# Patient Record
Sex: Male | Born: 1965 | Race: White | Hispanic: No | Marital: Married | Smoking: Never smoker
Health system: Southern US, Community
[De-identification: ages and names within clinical notes are randomized; demographics above are authoritative.]

## PROBLEM LIST (undated history)

## (undated) DIAGNOSIS — F32A Depression, unspecified: Secondary | ICD-10-CM

## (undated) DIAGNOSIS — F319 Bipolar disorder, unspecified: Secondary | ICD-10-CM

## (undated) DIAGNOSIS — E785 Hyperlipidemia, unspecified: Secondary | ICD-10-CM

## (undated) DIAGNOSIS — I10 Essential (primary) hypertension: Secondary | ICD-10-CM

## (undated) DIAGNOSIS — F4310 Post-traumatic stress disorder, unspecified: Secondary | ICD-10-CM

## (undated) DIAGNOSIS — I639 Cerebral infarction, unspecified: Secondary | ICD-10-CM

## (undated) HISTORY — PX: OTHER SURGICAL HISTORY: SHX169

## (undated) HISTORY — DX: Bipolar disorder, unspecified: F31.9

## (undated) HISTORY — PX: CHOLECYSTECTOMY: SHX55

## (undated) HISTORY — PX: HERNIA REPAIR: SHX51

## (undated) HISTORY — DX: Hyperlipidemia, unspecified: E78.5

---

## 2004-11-30 ENCOUNTER — Ambulatory Visit: Payer: Self-pay | Admitting: Family Medicine

## 2007-03-24 ENCOUNTER — Emergency Department: Payer: Self-pay | Admitting: Emergency Medicine

## 2007-05-04 ENCOUNTER — Emergency Department: Payer: Self-pay | Admitting: Emergency Medicine

## 2011-02-04 ENCOUNTER — Ambulatory Visit: Payer: Self-pay | Admitting: Internal Medicine

## 2011-03-10 ENCOUNTER — Ambulatory Visit: Payer: Self-pay | Admitting: Unknown Physician Specialty

## 2011-03-22 ENCOUNTER — Ambulatory Visit: Payer: Self-pay | Admitting: Unknown Physician Specialty

## 2011-03-23 LAB — PATHOLOGY REPORT

## 2012-02-13 LAB — URINALYSIS, COMPLETE
Bilirubin,UR: NEGATIVE
Blood: NEGATIVE
Glucose,UR: NEGATIVE mg/dL (ref 0–75)
Ketone: NEGATIVE
Leukocyte Esterase: NEGATIVE
RBC,UR: 1 /HPF (ref 0–5)
Specific Gravity: 1.03 (ref 1.003–1.030)
Squamous Epithelial: NONE SEEN

## 2012-02-13 LAB — DRUG SCREEN, URINE
Amphetamines, Ur Screen: NEGATIVE (ref ?–1000)
Barbiturates, Ur Screen: NEGATIVE (ref ?–200)
MDMA (Ecstasy)Ur Screen: NEGATIVE (ref ?–500)
Methadone, Ur Screen: NEGATIVE (ref ?–300)
Opiate, Ur Screen: NEGATIVE (ref ?–300)
Phencyclidine (PCP) Ur S: NEGATIVE (ref ?–25)

## 2012-02-13 LAB — COMPREHENSIVE METABOLIC PANEL
Albumin: 4.3 g/dL (ref 3.4–5.0)
Alkaline Phosphatase: 85 U/L (ref 50–136)
Anion Gap: 9 (ref 7–16)
BUN: 14 mg/dL (ref 7–18)
Bilirubin,Total: 2.4 mg/dL — ABNORMAL HIGH (ref 0.2–1.0)
Calcium, Total: 9.1 mg/dL (ref 8.5–10.1)
Chloride: 103 mmol/L (ref 98–107)
Creatinine: 1.25 mg/dL (ref 0.60–1.30)
Osmolality: 283 (ref 275–301)
Potassium: 4 mmol/L (ref 3.5–5.1)
SGPT (ALT): 27 U/L
Sodium: 141 mmol/L (ref 136–145)
Total Protein: 7.5 g/dL (ref 6.4–8.2)

## 2012-02-13 LAB — ETHANOL
Ethanol %: <0.003 % (ref 0.000–0.080)
Ethanol: <3 mg/dL

## 2012-02-13 LAB — CBC
HCT: 46 % (ref 40.0–52.0)
HGB: 16 g/dL (ref 13.0–18.0)
MCHC: 34.7 g/dL (ref 32.0–36.0)
MCV: 88 fL (ref 80–100)
Platelet: 215 10*3/uL (ref 150–440)

## 2012-02-13 LAB — TSH: Thyroid Stimulating Horm: 0.39 u[IU]/mL — ABNORMAL LOW

## 2012-02-14 ENCOUNTER — Inpatient Hospital Stay: Payer: Self-pay | Admitting: Psychiatry

## 2012-02-17 LAB — HEPATIC FUNCTION PANEL A (ARMC)
Albumin: 4.3 g/dL (ref 3.4–5.0)
Alkaline Phosphatase: 83 U/L (ref 50–136)
Bilirubin, Direct: 0.1 mg/dL (ref 0.00–0.20)
Bilirubin,Total: 0.8 mg/dL (ref 0.2–1.0)

## 2012-02-20 LAB — SALICYLATE LEVEL: Salicylates, Serum: <1.7 mg/dL

## 2012-02-20 LAB — DRUG SCREEN, URINE
Benzodiazepine, Ur Scrn: POSITIVE (ref ?–200)
Cannabinoid 50 Ng, Ur ~~LOC~~: NEGATIVE (ref ?–50)
Cocaine Metabolite,Ur ~~LOC~~: NEGATIVE (ref ?–300)
MDMA (Ecstasy)Ur Screen: NEGATIVE (ref ?–500)
Methadone, Ur Screen: NEGATIVE (ref ?–300)
Opiate, Ur Screen: NEGATIVE (ref ?–300)
Phencyclidine (PCP) Ur S: NEGATIVE (ref ?–25)
Tricyclic, Ur Screen: NEGATIVE (ref ?–1000)

## 2012-02-20 LAB — URINALYSIS, COMPLETE
Bilirubin,UR: NEGATIVE
Blood: NEGATIVE
Ketone: NEGATIVE
Ph: 7 (ref 4.5–8.0)
Protein: NEGATIVE
Specific Gravity: 1.02 (ref 1.003–1.030)
Squamous Epithelial: NONE SEEN

## 2012-02-20 LAB — CBC
HCT: 48 % (ref 40.0–52.0)
HGB: 16.2 g/dL (ref 13.0–18.0)
MCH: 29.9 pg (ref 26.0–34.0)
MCHC: 33.7 g/dL (ref 32.0–36.0)
MCV: 89 fL (ref 80–100)
Platelet: 235 10*3/uL (ref 150–440)
RBC: 5.41 10*6/uL (ref 4.40–5.90)

## 2012-02-20 LAB — COMPREHENSIVE METABOLIC PANEL
Alkaline Phosphatase: 83 U/L (ref 50–136)
Bilirubin,Total: 0.9 mg/dL (ref 0.2–1.0)
Calcium, Total: 9 mg/dL (ref 8.5–10.1)
Co2: 25 mmol/L (ref 21–32)
Creatinine: 1.05 mg/dL (ref 0.60–1.30)
EGFR (African American): >60
EGFR (Non-African Amer.): >60
SGOT(AST): 31 U/L (ref 15–37)
SGPT (ALT): 33 U/L

## 2012-02-20 LAB — ETHANOL
Ethanol %: <0.003 % (ref 0.000–0.080)
Ethanol: <3 mg/dL

## 2012-02-21 ENCOUNTER — Inpatient Hospital Stay: Payer: Self-pay | Admitting: Psychiatry

## 2012-02-21 DIAGNOSIS — Z01810 Encounter for preprocedural cardiovascular examination: Secondary | ICD-10-CM

## 2012-02-21 LAB — CARBAMAZEPINE LEVEL, TOTAL: Carbamazepine: 11.6 ug/mL (ref 4.0–12.0)

## 2012-02-28 ENCOUNTER — Ambulatory Visit: Payer: Self-pay | Admitting: Unknown Physician Specialty

## 2012-03-26 ENCOUNTER — Ambulatory Visit: Payer: Self-pay | Admitting: Psychiatry

## 2012-04-25 ENCOUNTER — Ambulatory Visit: Payer: Self-pay | Admitting: Psychiatry

## 2012-05-26 ENCOUNTER — Ambulatory Visit: Payer: Self-pay | Admitting: Psychiatry

## 2013-01-11 ENCOUNTER — Ambulatory Visit: Payer: Self-pay | Admitting: Family Medicine

## 2013-01-31 ENCOUNTER — Ambulatory Visit: Payer: Self-pay | Admitting: Family Medicine

## 2014-08-26 DIAGNOSIS — I639 Cerebral infarction, unspecified: Secondary | ICD-10-CM

## 2014-08-26 HISTORY — DX: Cerebral infarction, unspecified: I63.9

## 2014-09-20 ENCOUNTER — Ambulatory Visit: Payer: Self-pay | Admitting: Ophthalmology

## 2014-09-20 ENCOUNTER — Inpatient Hospital Stay: Payer: Self-pay | Admitting: Internal Medicine

## 2014-09-20 LAB — COMPREHENSIVE METABOLIC PANEL
Albumin: 3.9 g/dL (ref 3.4–5.0)
Alkaline Phosphatase: 85 U/L
Anion Gap: 5 — ABNORMAL LOW (ref 7–16)
BUN: 18 mg/dL (ref 7–18)
Bilirubin,Total: 1.2 mg/dL — ABNORMAL HIGH (ref 0.2–1.0)
CO2: 27 mmol/L (ref 21–32)
CREATININE: 1.23 mg/dL (ref 0.60–1.30)
Calcium, Total: 8.8 mg/dL (ref 8.5–10.1)
Chloride: 108 mmol/L — ABNORMAL HIGH (ref 98–107)
EGFR (African American): >60
EGFR (Non-African Amer.): >60
Glucose: 107 mg/dL — ABNORMAL HIGH (ref 65–99)
Osmolality: 282 (ref 275–301)
Potassium: 4.3 mmol/L (ref 3.5–5.1)
SGOT(AST): 42 U/L — ABNORMAL HIGH (ref 15–37)
SGPT (ALT): 43 U/L
Sodium: 140 mmol/L (ref 136–145)
Total Protein: 7.1 g/dL (ref 6.4–8.2)

## 2014-09-20 LAB — TROPONIN I
Troponin-I: <0.02 ng/mL
Troponin-I: <0.02 ng/mL
Troponin-I: <0.02 ng/mL

## 2014-09-20 LAB — CK TOTAL AND CKMB (NOT AT ARMC)
CK, TOTAL: 286 U/L
CK, TOTAL: 197 U/L
CK, Total: 218 U/L
CK-MB: 6.4 ng/mL — ABNORMAL HIGH (ref 0.5–3.6)
CK-MB: 5 ng/mL — ABNORMAL HIGH (ref 0.5–3.6)
CK-MB: 4.8 ng/mL — ABNORMAL HIGH (ref 0.5–3.6)

## 2014-09-20 LAB — PROTIME-INR
INR: 1
Prothrombin Time: 12.8 secs (ref 11.5–14.7)

## 2014-09-20 LAB — CBC WITH DIFFERENTIAL/PLATELET
BASOS PCT: 0.5 %
Basophil #: 0 10*3/uL (ref 0.0–0.1)
Eosinophil #: 0.2 10*3/uL (ref 0.0–0.7)
Eosinophil %: 3.3 %
HCT: 49.2 % (ref 40.0–52.0)
HGB: 16.4 g/dL (ref 13.0–18.0)
Lymphocyte #: 2.2 10*3/uL (ref 1.0–3.6)
Lymphocyte %: 30.4 %
MCH: 30 pg (ref 26.0–34.0)
MCHC: 33.3 g/dL (ref 32.0–36.0)
MCV: 90 fL (ref 80–100)
MONOS PCT: 7.9 %
Monocyte #: 0.6 x10 3/mm (ref 0.2–1.0)
Neutrophil #: 4.2 10*3/uL (ref 1.4–6.5)
Neutrophil %: 57.9 %
Platelet: 215 10*3/uL (ref 150–440)
RBC: 5.46 10*6/uL (ref 4.40–5.90)
RDW: 13.9 % (ref 11.5–14.5)
WBC: 7.3 10*3/uL (ref 3.8–10.6)

## 2014-09-20 LAB — LITHIUM LEVEL: Lithium: 0.43 mmol/L — ABNORMAL LOW

## 2014-09-20 LAB — LIPID PANEL
Cholesterol: 183 mg/dL (ref 0–200)
HDL: 53 mg/dL (ref 40–60)
LDL CHOLESTEROL, CALC: 98 mg/dL (ref 0–100)
TRIGLYCERIDES: 162 mg/dL (ref 0–200)
VLDL CHOLESTEROL, CALC: 32 mg/dL (ref 5–40)

## 2014-09-20 LAB — APTT: Activated PTT: 27.8 secs (ref 23.6–35.9)

## 2014-09-21 LAB — CBC WITH DIFFERENTIAL/PLATELET
BASOS PCT: 0.6 %
Basophil #: 0 10*3/uL (ref 0.0–0.1)
Eosinophil #: 0.2 10*3/uL (ref 0.0–0.7)
Eosinophil %: 3.2 %
HCT: 47.6 % (ref 40.0–52.0)
HGB: 15.3 g/dL (ref 13.0–18.0)
Lymphocyte #: 2.2 10*3/uL (ref 1.0–3.6)
Lymphocyte %: 31.9 %
MCH: 29.3 pg (ref 26.0–34.0)
MCHC: 32.1 g/dL (ref 32.0–36.0)
MCV: 91 fL (ref 80–100)
Monocyte #: 0.5 x10 3/mm (ref 0.2–1.0)
Monocyte %: 7.9 %
NEUTROS ABS: 3.8 10*3/uL (ref 1.4–6.5)
Neutrophil %: 56.4 %
Platelet: 217 10*3/uL (ref 150–440)
RBC: 5.22 10*6/uL (ref 4.40–5.90)
RDW: 13.5 % (ref 11.5–14.5)
WBC: 6.8 10*3/uL (ref 3.8–10.6)

## 2014-09-21 LAB — BASIC METABOLIC PANEL
ANION GAP: 4 — AB (ref 7–16)
BUN: 14 mg/dL (ref 7–18)
CALCIUM: 9 mg/dL (ref 8.5–10.1)
CREATININE: 1.34 mg/dL — AB (ref 0.60–1.30)
Chloride: 106 mmol/L (ref 98–107)
Co2: 29 mmol/L (ref 21–32)
Glucose: 97 mg/dL (ref 65–99)
OSMOLALITY: 278 (ref 275–301)
Potassium: 3.9 mmol/L (ref 3.5–5.1)
Sodium: 139 mmol/L (ref 136–145)

## 2015-04-18 NOTE — H&P (Signed)
PATIENT NAME:  Ryan Gomez, Alastor S MR#:  [id number] DATE OF BIRTH:  1966/05/17  DATE OF ADMISSION:  09/20/2014  ADMITTING PHYSICIAN: Enid Baasadhika Waverley Krempasky, MD  [facility]IC[facility]: [facility]  CHIEF COMPLAINT: Right lateral visual loss.   HISTORY OF PRESENT ILLNESS: Mr. Ryan Gomez is a 49 year old Caucasian male with a past medical history significant for bipolar with depression who presents to the hospital as his outpatient MRI was positive for a stroke. The patient describes his symptoms started about 2 weeks ago, then he noticed that he was having right-sided peripheral vision loss. He was walking with  his wife and he could not see her when she was on the right side and all of a sudden, she presented into the visual field. He had to turn his head to look at things on the right side. He went to see an ophthalmologist about the same time, who did visual field testing and, because of a visual field defect, he ordered an outpatient MRI. The MRI was done today; it showed subacute left occipital infarct, and the patient was called in to be admitted to the hospital. He complains of some headache, mostly right frontal lobe, but other than that, he denies any tingling, numbness, localized weakness, slurred speech, or any other changes.   PAST MEDICAL HISTORY: Bipolar with depression and anxiety.   PAST SURGICAL HISTORY: None.  ALLERGIES TO MEDICATIONS: No known drug allergies.   CURRENT HOME MEDICATION:  1.  Lithium 900 mg p.o. at bedtime.  2.  Wellbutrin 150 mg p.o. b.i.d.  3.  Buspirone 10 mg p.o. b.i.d.  4.  Fluoxetine 40 mg p.o. daily.  5.  Omeprazole 20 mg p.o. daily.   SOCIAL HISTORY: Lives at home with his wife. Works as a Chartered certified accountantmachinist. No smoking. Occasional alcohol use.   FAMILY HISTORY: Diabetes and heart stroke runs on both sides of the family.   REVIEW OF SYSTEMS:  CONSTITUTIONAL: No fever, fatigue, or weakness.  EYES: Right peripheral visual loss. No inflammation, glaucoma, or cataracts.   ENT: No tinnitus, ear pain, hearing loss, epistaxis, or discharge.  RESPIRATORY: No cough, wheeze, hemoptysis, or COPD.  CARDIOVASCULAR: No chest pain, orthopnea, edema, arrhythmia, palpitations or syncope.  GASTROINTESTINAL: No nausea, vomiting, diarrhea, abdominal pain, hematemesis or melena.  GENITOURINARY: No dysuria, hematuria, renal calculus, frequency, or incontinence.  ENDOCRINE: No polyuria, nocturia, thyroid problems, heat or cold intolerance.  HEMATOLOGY: No anemia, easy bruising or bleeding.  SKIN: No acne, rash or lesions.  MUSCULOSKELETAL: No neck fracture, pain, arthritis, or gout. NEUROLOGIC: Positive for right peripheral vision loss with CVA positive on MRI. No TIA, seizures, tremors, vertigo, numbness, or weakness.  PSYCHOLOGICAL: History of anxiety and depression; none currently.   PHYSICAL EXAMINATION:  VITAL SIGNS: Temperature 98.3 degree Fahrenheit, pulse 54, respirations 16, blood pressure 120/84, pulse oximetry 100% on room air.  GENERAL: Well-built, well-nourished male, lying in bed, not in any acute distress.  HEENT: Normocephalic, atraumatic. Pupils equal, round and reacting to light. Anicteric sclerae. Extraocular movements intact. Oropharynx clear without erythema, mass or exudates.   NECK: Supple. No thyromegaly, JVD or carotid bruits. No lymphadenopathy.  LUNGS: Moving air bilaterally. No wheeze or crackles. No use of accessory muscles for breathing.  CARDIOVASCULAR: S1, S2, regular rate and rhythm. No murmurs, rubs, or gallops.  ABDOMEN: Soft, nontender, nondistended. No hepatosplenomegaly. Normal bowel sounds.  EXTREMITIES: No pedal edema. No clubbing or cyanosis. There are 2+ dorsalis pulses palpable bilaterally.  SKIN: No acne, rash or lesions.  LYMPHATICS: No cervical or inguinal  lymphadenopathy.  NEUROLOGIC: Positive for right-sided hemianopsia, but otherwise cranial nerves intact and 5/5 strength in all 4 extremities. Sensation intact. Normal cerebellar  function tests.  PSYCHOLOGIC: The patient is awake, alert, oriented x 3.   LABORATORY DATA: WBC 7.3, hemoglobin 16.4, hematocrit 49.2, platelet count 215,000.   Sodium 140, potassium 4.3, chloride 108, bicarbonate 27, BUN 18, creatinine 1.23, glucose 107, and calcium of 8.8.   ALT 43, AST 42, alkaline phosphatase 85, total bilirubin 1.2, albumin of 3.9. Troponins negative. INR is 1.0.   MRI of the brain, with and without contrast, showing a subacute infarct in the left occipital lobe. No other abnormality noted in the orbits.   EKG: Sinus bradycardia. No acute ST-T wave abnormalities. Heart rate of 47.   ASSESSMENT AND PLAN: A 49 year old male with history of bipolar depression comes with right hemianopsia for 2 weeks and MRI with subacute infarct.  1.  Cerebrovascular accident with subacute left occipital infarct; symptoms started 2 weeks ago. We will admit for neuro checks, carotid Dopplers, echocardiogram, start on aspirin, check lipid profile. To be discharged tomorrow, likely.  2.  Bradycardia. Sinus brady, no arrhythmias. Monitor on telemetry for 24 hours. Not symptomatic. Continue to monitor.  3.  Bipolar with depression. Continue home medications at this time.  4.  Deep vein thrombosis prophylaxis.   CODE STATUS: Full Code.   TIME SPENT ON ADMISSION: 50 minutes   ____________________________ Enid Baas, MD rk:MT D: 09/20/2014 14:52:24 ET T: 09/20/2014 15:22:16 ET JOB#: [id number]  cc: Enid Baas, MD, <Dictator> Enid Baas MD ELECTRONICALLY SIGNED 09/25/2014 9:50

## 2015-04-18 NOTE — Discharge Summary (Signed)
PATIENT NAME:  Ryan Gomez, Ryan Gomez MR#:  [id number] DATE OF BIRTH:  [birth year]  ADMISSION DIAGNOSIS: Subacute cerebrovascular accident.   DISCHARGE DIAGNOSES: 1.  Subacute occipital cerebrovascular accident. 2.  Bradycardia, asymptomatic.  3.  Bipolar affective disorder.   CONSULTATIONS: None.   IMAGING:  1.  The patient had an MRI, which was positive for suboccipital left CVA.  2.  Carotid ultrasound showed no evidence of significant stenosis. 3.  A 2-D echocardiogram showed an EF of 55% to 60%, with impaired relaxation, LV diastolic dysfunction.  4.  LDL was 98, cholesterol 183.   HOSPITAL COURSE: A 49 year old male who was sent from his ophthalmologist'Gomez office due to an MRI that showed occipital CVA.  For further details, please refer to the H and P.  1.  Subacute left occipital infarct that happened about 2 weeks ago; symptoms are improving. The patient was admitted for neurologic checks, carotid Dopplers and echocardiogram. His echocardiogram and carotid Dopplers were essentially unremarkable. He will continue aspirin and statin.  2. Bradycardia, sinus bradycardia, no arrhythmia. The patient is asymptomatic. A 2-D echocardiogram was unremarkable.  3.  History of bipolar with depression. The patient is continued on his outpatient medication.   DISCHARGE MEDICATIONS: 1.  Aspirin 325 daily.  2.  Simvastatin 10 mg at bedtime. 3.  Buspirone 10 mg b.i.d.  4.  Omeprazole 20 mg daily. 5.  Lithium 900 mg at bedtime.  6.  Fluoxetine 40 mg daily.  7.  Wellbutrin 150 b.i.d.   DISCHARGE DIET: Low sodium.   DISCHARGE ACTIVITY: As tolerated.   DISCHARGE FOLLOWUP: The patient can follow up with his primary care physician in 1 week. The patient is stable for discharge.  TIME 35 minutes ____________________________ Devine Dant P. Juliene PinaMody, MD spm:LT D: 09/21/2014 11:24:42 ET T: 09/21/2014 16:12:00 ET JOB#: [id number]  cc: Miarose Lippert P. Juliene PinaMody, MD, <Dictator> [facility] Breanda Greenlaw  MD ELECTRONICALLY SIGNED 09/22/2014 13:17

## 2015-04-19 NOTE — H&P (Signed)
PATIENT NAME:  Ryan Gomez, Ryan Gomez MR#:  [id number] DATE OF BIRTH:  1966/04/21  DATE OF ADMISSION:  02/14/2012  REFERRING PHYSICIAN: Dr. Daryel NovemberJonathan Williams ATTENDING PHYSICIAN: Dr. Kristine LineaJolanta Pleas Carneal  IDENTIFYING DATA: Mr. Ryan Gomez is a 49 year old male with history of treatment resistant depression.   CHIEF COMPLAINT: "Dr. Lafayette Dragonarr sent me here."   HISTORY OF PRESENT ILLNESS: Mr. Ryan Gomez has been treated for depression at least since 1992. He has been lately in the care of Dr. Lafayette Dragonarr in [place] for the past 7 to 8 years. He reports that he has been tried on every medication known to man except for lithium. On the day of admission he had an appointment with Dr. 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 He reportedly put the belt around his neck and tried to suffocate himself. Dr. Lafayette Dragonarr referred him to the nearest Emergency Room immediately. He came to the Emergency Room accompanied by his wife and his son. The wife is very supportive. She is shaken. The patient has no history of suicide attempts in the past. Also Dr. Lafayette Dragonarr suggested that maybe she ran out of ideas for further medication management and suggested ECT. The wife is very uncertain about ECT. The patient seems open minded. He has been recently taking Cymbalta. This was started just two weeks ago, switched from Zoloft. In addition to Cymbalta he is taking Synthroid for augmentation. Dr. Lafayette Dragonarr suggested also lithium for augmentation in the future if there was no improvement. The patient denies alcohol, prescription pills, or illicit substance use. He denies psychotic symptoms or symptoms suggestive of bipolar mania recently, however, he was in a lot of trouble when he was serving in [employer] in the 90s.  Actually he was discharged with a diagnosis of personality disorder in [id number]. Prior to his discharge from the [employer] he got into some trouble in [place], was treated in [place] and was on a suicide watch until he was discharged from the [employer]. He denies as above suicide attempts and does not believes that his discharge diagnosis was accurate but does report periods of time in the past of increased mood, insomnia, hyperactivity, pressured speech, racing thoughts, high energy and feeling of invincibility. This type of symptoms can easily get you in trouble with the [employer] and Compan[employer].   PAST PSYCHIATRIC HISTORY: Other than hospitalization in [place] in 1992 he has never been hospitalized. Denies suicide attempt. He is unable to provide me with a list of medications that have been tried. Upon review apparently he is not familiar with the name Tegretol and he has never been tried on Luvox even though he reports not only symptoms of anxiety but also symptoms suggestive of obsessive-compulsive traits. He says that it is better now but he used to count, check, clean. It was the reason for many arguments at home when he needed everything organized. 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 He reports that he has been wasting a lot of time to check, recheck and recheck the specifications again.   FAMILY PSYCHIATRIC HISTORY: None reported.   PAST MEDICAL HISTORY:  1. Hypothyroidism.  2. Gastroesophageal reflux disease.   ALLERGIES: No known drug allergies.   MEDICATIONS ON ADMISSION:  1. Xanax 1 mg 3 times daily.  2. Cymbalta 60 mg daily.  3. Synthroid 25 mcg daily.  4. Prilosec 20 mg daily.   SOCIAL HISTORY: He is married. He has a son. He feels terribly guilty that his depression interferes with his family life. He does not feel that he has been sensitive to his wife and son'Gomez needs. Oftentimes he is irritated or unable to participate in family events. He, as above, works as a  Chartered certified accountant. He finds this job too difficult to handle lately. He is working with his employer to decrease his responsibilities. I think that he is getting ready to apply for disability.   LEGAL HISTORY: There are no legal troubles.    REVIEW OF SYSTEMS: CONSTITUTIONAL: No fevers or chills. No weight changes. EYES: No double or blurred vision. ENT: No hearing loss. RESPIRATORY: No shortness of breath or cough. CARDIOVASCULAR: No chest pain or orthopnea. GASTROINTESTINAL: No abdominal pain, nausea, vomiting, or diarrhea. GENITOURINARY: No incontinence or frequency. ENDOCRINE: No heat or cold intolerance. LYMPHATIC: No anemia or easy bruising. INTEGUMENTARY: No acne, rash. MUSCULOSKELETAL: Normal muscle strength in all extremities. NEUROLOGICAL: No tingling or weakness. PSYCHIATRIC: See history of present illness for details.   PHYSICAL EXAMINATION:  VITAL SIGNS: Blood pressure 115/72, pulse 71, respirations 18, temperature 98.1.   GENERAL: This is a well-developed male in no acute distress.   HEENT: The pupils are equal, round, and reactive to light. Sclera anicteric.   NECK: Supple. No thyromegaly.   LUNGS: Clear to auscultation. No dullness to percussion.   HEART: Regular rhythm and rate. No murmurs, rubs, or gallops.   ABDOMEN: Soft, nontender, nondistended. Positive bowel sounds.   MUSCULOSKELETAL: Normal muscle strength in all extremities.   SKIN: No rashes or bruises.   LYMPHATIC: No cervical adenopathy.   NEUROLOGIC: Cranial nerves II through XII are intact. Normal gait.   LABORATORY, DIAGNOSTIC, AND RADIOLOGICAL DATA: Chemistries are within normal limits except for blood glucose of 126. Blood alcohol level zero. LFTs within normal limits except for elevated total bilirubin 2.4. TSH 0.39. Urine tox screen positive for benzodiazepines. CBC within normal limits. Urinalysis is not suggestive of urinary tract infection.   MENTAL STATUS EXAMINATION ON ADMISSION: The patient is alert and  oriented to person, place, time, and situation. He is pleasant, polite, and cooperative but there is severe psychomotor retardation. He maintains limited eye contact. His speech is slow and deliberate. He is wearing hospital scrubs and a yellow shirt. Mood is depressed with flat affect. Thought processing is logical and goal oriented but slow. He denies suicidal or homicidal ideations but came to the hospital after placing a belt around his neck to suffocate. There are no thoughts of hurting others. There are no delusions or paranoia. There are no visual or auditory hallucinations. His cognition is grossly intact. He registers three out of three and recalls three out of three objects after five minutes. He can spell world forwards and backwards. He can name current president. His abstraction is intact. His insight and judgment are fair.   SUICIDE RISK ASSESSMENT ON ADMISSION: This is a patient with a long history of mental illness, mood instability, treatment resistant depression who came to the hospital after a suicidal gesture.   ASSESSMENT:  AXIS I: Mood disorder, not otherwise specified.   AXIS II: Personality disorder, not otherwise specified.   AXIS III:  1. Gastroesophageal reflux disease. 2. Hypothyroidism.   AXIS IV: Treatment resistant depression, occupation, family conflict.   AXIS V: GAF on admission 20.   PLAN: The patient was admitted to [facility] for safety, stabilization and medication management. He was initially placed on suicide precautions and was closely monitored for any unsafe behaviors. He underwent full psychiatric and risk assessment. He received pharmacotherapy, individual and group psychotherapy, substance abuse counseling, and support from therapeutic milieu.  1. Suicidality. This has resolved. The patient is able to contract for safety.  2. Depression and anxiety. It is difficult to see whether or not diagnosis of  bipolar disorder was considered. The patient was treated with Depakote and Lamictal as far as he remembers. We will start Tegretol for mood stabilization. Will switch him from Cymbalta to Luvox higher dose, 200-300 mg a day, to address OCD symptoms. I will place him on him a sleeping aid.  3. Diagnostic clarification. Will order MMPI and psychological consultation.  4. Medical. We will continue Prilosec and Synthroid.  5. ECT. One of the goals of this hospitalization is to consider ECT. The patient is uncertain. I will ask Dr. Toni Amend for a consultation.      6. Disposition. He will be discharged to home with his wife. Will follow up with Dr. Lafayette Dragon.  ____________________________ Ellin Goodie. Jennet Maduro, MD jbp:cms D: 02/15/2012 17:44:58 ET T: 02/16/2012 05:49:13 ET JOB#: [id number]  cc: Sherma Vanmetre B. Jennet Maduro, MD, <Dictator> Shari Prows MD ELECTRONICALLY SIGNED 02/18/2012 4:36

## 2015-04-19 NOTE — Consult Note (Signed)
Psychological Assessment  Ryan Gomez Evaluation: 2-[birth year]13Administered: [place] (MMPI-2) for Referral: Mr. Ryan Gomez was referred for a psychological assessment by his physician, Ryan Gomez.  He was admitted to [facility] for treatment of increasing depression and suicidal behavior. Please see the history and physical and psychosocial history for further background information. An assessment of personality structure was requested. Mr. Ryan Gomez MMPI-2 protocol is compared to that of other adult males he obtained the following profile: [id number]/:#. The MMPI-2 validity scales indicate that the clinical profile is valid. They also suggest he is likely to be experiencing a long standing and serious disturbance. PresentationHe reports that he is experiencing moderate to severe emotional distress characterized by brooding, anxiety, worrying and anhedonia. He is depressed and he broods and ruminates about his problems and resentments. His daily life has few things that keep him interested. He frequently worries about something or someone and is generally apprehensive. He is more sensitive and feels more intensely than most people. His feelings are easily hurt and he is inclined to take things hard. He easily becomes impatient with people. He is often irritable and grouchy. It makes him angry when people hurry him. He has become so angry that he does not know what comes over him and he feels as though he will explode. At times he has fits of laughing and crying that he cannot control. He reports that he has problems with concentration and memory. He has a hard time making decisions and he feels helpless when he has to make some important decisions because of his ruminations. He has often lost out on things because he could not make up his mind quickly enough. He has met problems so full of possibilities that he has been unable to make up his mind  about them. Sometimes some unimportant thought will run through his mind and bother him for days. He is apt to take disappointments so keenly that he cannot put them out of his mind. He frequently projects his problems onto others. He lacks self-confidence and believes that he is not as good as other people. He thinks he is useless at times. He is sure that he is being talked about and he has often felt that strangers were looking at him critically. Once in a while he thinks of things that are too bad to talk about and he dreams frequently about things that are best kept to himself. He is afraid of losing his mind or that he is about to go to pieces. His judgment is not as good as it once was. He does not try to understand the motives for his own or others? behavior. He does many things that he later regrets. He has often been misunderstood when he was trying to be helpful. He is not happy with himself the way he is and wishes he could be as happy as others seem to be. Relations: He reports that he is introverted and uncomfortable in interpersonal situations. Whenever possible he avoids being in a crowd. He wishes he was not so shy, is easily embarrassed, is unusually self-conscious and frequently has to fight against showing that he is bashful. He has trouble thinking of the right things to say when in a group of people. He is likely not to speak to people until they speak to him. Even when he is with people, he feels lonely much of the time. Problem Areas: He reports a number of physical symptoms. He has difficulty going to sleep because thoughts  or ideas are bothering him and he does not wake up fresh and rested most mornings. At times he is all full of energy. He has periods of such great restlessness that he cannot sit long in a chair. He is not as able to work as he once was. Suicidal ideation should be monitored carefully.  His prognosis is generally guarded because his problems tend to be chronic and  characterologic. Psychopharmacologic interventions may be necessary to alleviate his excessive worrying and ruminations. Cognitive-behavioral treatments focused on his anxiety symptoms may be helpful. Short-term, behavioral interventions that focus on his reasons for entering treatment will be most effective. Impression:Disorder NOS R/O Bipolar DisorderDisorder NOS R/O OCDDisorder NOS with Borderline features   Electronic Signatures: Garald Braver (PsyD, HSP-P)  (Signed on 22-Feb-13 13:28)  Authored  Last Updated: 22-Feb-13 13:28 by Garald Braver (PsyD, HSP-P)

## 2015-04-19 NOTE — H&P (Signed)
PATIENT NAME:  Ryan Gomez, Ryan Gomez MR#:  [id number] DATE OF BIRTH:  [birth year]  DATE OF ADMISSION:  02/21/2012  IDENTIFYING INFORMATION/CHIEF COMPLAINT:  The patient is a 49 year old man who presented himself to the Emergency Room with a complaint, "I'm very depressed. I shouldn't have left."   HISTORY OF PRESENT ILLNESS: The patient was just discharged from the hospital a day or two previously. He had been in the hospital for several days for treatment of major depression. He says that he went home and in that time his depression continued to persist. He tried to go to work and was unable to concentrate or muster any energy at work. He felt like he was going to have a nervous breakdown and had to come home. His current symptoms include severely depressed mood, feelings of hopelessness, lack of energy, sleeps excessively and feels tired during the day. Appetite is intermittent. He has had some suicidal ideation and yesterday thought of driving his car off the road. He feels like things are hopeless and he is helpless to get any better. He says he has been taking his current psychiatric medicine. He denies that he has been drinking or using any drugs. Overall, he reports that he has had a depressed mood for about 13 years intermittently. It has been extremely bad for the last 3 or 4 months.   PAST PSYCHIATRIC HISTORY: The patient evidently had problems with his mood when he was in the [employer] years ago. [place]. Diagnosis and condition are unclear, possibly manic, possibly just a behavior problem. When I interviewed him today, the patient denied that he had ever had any history of full manic episodes. He says that his mood stays extremely down and depressed all the time. He has been seeing a psychiatrist, Dr. Evelene Croon, in [place] and has been on multiple medications. He is currently taking Cymbalta but in the past has taken Zoloft, Symbyax, Abilify, Depakote, all without  any benefit. On his last hospitalization here, he was switched from the Cymbalta to Luvox. He also was put on Tegretol. The patient denies ever making any real suicide attempts in the past. He denies any history of clear psychotic symptoms.   SOCIAL HISTORY: He is married. He has a 10 year old son. The patient is employed as a Chartered certified accountant. He feels like his job has seemed to be more stressful even though it really has not changed over the last few years. His wife is described as being extremely supportive of him.    PAST MEDICAL/SURGICAL HISTORY: No significant ongoing medical problems. He has had a cholecystectomy, and some throat surgery, and some orthopedic surgery on an elbow in the past. He never had any difficulty with anesthesia. He does not have high blood pressure. He does not have heart disease. He does not have any lung disease.   MEDICATIONS: (Both started on his previous admission.)  1. Luvox 100 mg b.i.d.  2. Tegretol 200 mg b.i.d.    ALLERGIES: No known drug allergies.   SUBSTANCE ABUSE HISTORY: He denies that he uses any alcohol recently. He says that he used to drink quite a bit in the past but that it has been years since then. He denies any past history of drug abuse.   REVIEW OF SYSTEMS: Mood is extremely depressed, tearful and sad. He feels hopeless. He also complains of obsessive worry and anxiety. He says that he will get worries on his mind that he can't stop thinking about at times. He  does not describe any clear compulsive behaviors.   MENTAL STATUS EXAM: A healthy-appearing man interviewed in the Emergency Room. Cooperative with the interview. Eye contact decreased. Psychomotor activity decreased. Speech quiet but easy to understand. Affect was tearful throughout the interview. Mood was described as depressed. He was sobbing at several points. Thoughts appear to be free of grossly psychotic thinking, a little bit slow and tangential at times. He denies hallucinations. He  does have some recent suicidal ideation without intent in the hospital. No homicidal ideation. Judgment and insight are somewhat impaired by illness.   PHYSICAL EXAMINATION:  VITAL SIGNS: Right now, I am not seeing any vital signs entered into the computer yet.   SKIN: No skin lesions identified.   HEENT: Pupils are equal and reactive. Face symmetric.   NEUROLOGICAL: Cranial nerves are all intact.   NECK: Supple and nontender.   BACK: Nontender.  MUSCULOSKELETAL: Full range of motion at all extremities. Normal gait.   LUNGS: Clear to auscultation without wheezing.   HEART: Regular rate and rhythm.   ABDOMEN: Soft and nontender, normal bowel sounds. Strength intact and symmetric throughout, as are reflexes.   ASSESSMENT: A 49 year old man with severe major depression. Possibility has been raised of bipolar disorder, but recent symptoms all seem to be severely depressed. He has been resistant to antidepressants after several trials. ECT had been discussed on his previous hospitalization, but he had declined it at that point. After going home and finding that his functioning remained extremely bad, he has come back specifically requesting ECT. The patient is a good candidate for ECT based on his diagnosis of major depression, his failure to respond to multiple medication trials, his lack of contraindication and his interest in ECT as well as his severe incapacity and inability to work and function at home due to his depression.   TREATMENT PLAN: The patient will be admitted to the hospital, and I will begin evaluation and work-up for ECT. Labs will be reviewed, and the full usual lab routine and x-rays and EKG will be done. I have already contacted the nurse who manages ECT, who will be seeing him this afternoon and getting the consent signed and the paperwork done. We will plan to start ECT tomorrow. I plan to start right unilateral treatment to decrease side effects. The patient is apprised of  this. We plan to use Brevital and Anectine anesthetic. Anesthesia consult is ordered.   DIAGNOSIS, PRINCIPAL AND PRIMARY:  AXIS I: Major depressive episode, severe and recurrent.   SECONDARY DIAGNOSES:  AXIS I: No further diagnosis.   AXIS II: Deferred.   AXIS III: No diagnosis.   AXIS IV: Severe from current illness and disability.     AXIS V: Functioning at time of evaluation 25.   ____________________________ Audery AmelJohn T. Eliya Bubar, MD jtc:cbb D: 02/21/2012 17:36:48 ET T: 02/21/2012 17:56:58 ET JOB#: [id number]  cc: Audery AmelJohn T. Dalvin Clipper, MD, <Dictator> Audery AmelJOHN T Pistol Kessenich MD ELECTRONICALLY SIGNED 02/22/2012 22:15

## 2015-04-19 NOTE — Discharge Summary (Signed)
PATIENT NAME:  Ryan Gomez, Ryan Gomez MR#:  [id number] DATE OF BIRTH:  12/16/1966  DATE OF ADMISSION:  02/21/2012 DATE OF DISCHARGE:  02/28/2012  HOSPITAL COURSE: See dictated history and physical for details of admission. The patient is a 49 year old man with a history of major depression who was readmitted to the hospital shortly after his last hospitalization. He continued to report severe depression and anxiety with obsessive worries, inability to function, and inability to do his job. He was having some suicidal ideation, although not acutely threatening. He had specifically come back to the hospital wanting evaluation for ECT. ECT had been suggested on his previous hospitalization but at that time he had declined. After going home and discussing the situation with his family and evaluating how anxious and depressed he was, he returned for ECT. The patient willingly agreed to begin ECT treatment. He was given information regarding risks and benefits and allowed to ask all the questions he needed to about the treatment. Full work-up was done with no problem. He was started with ECT and received a first treatment on February 27th. Right unilateral ECT was done. The patient tolerated the treatment well with minimal or no side effects. He continued receiving three times a week ECT during his hospital course. At the same time he continued on the same medication he had previously been taking. He was engaged in groups and activities on the unit. The patient showed improvement in his mood. Did not engage in any suicidal behavior. Was no longer endorsing suicidal ideation. After consultation with the patient and his family about outpatient ECT, it was agreed that he would be discharged home with follow-up to continue index course ECT as an outpatient.   DISPOSITION: 1. Discharged home on current medication.  2. Follow-up with three times a week ECT.  3. The patient had requested that he change his outpatient  psychiatry visits to seeing Dr. Toni Amend and this will be arranged.   DISCHARGE MEDICATIONS:  1. Luvox 100 mg twice a day.  2. Trazodone 100 mg at bedtime.  3. Ambien 10 mg at bedtime p.r.n. for sleep.  4. Prilosec 40 mg in the morning.   LABORATORY DATA: Drug screen was positive for benzodiazepines. Tegretol level 11.6. Thyroid stimulating hormone 0.51. Ethanol level undetectable. Chemistry panel unremarkable. CBC unremarkable. Urinalysis unremarkable. EKG unremarkable.   I should note that Tegretol was discontinued at the time of admission so as not to interfere with the ECT and he tolerated this fine. A follow-up Tegretol level was done shortly before his first treatment and was 11.6.   MENTAL STATUS EXAM AT DISCHARGE: Neatly dressed and groomed man. Good eye contact. Somewhat slowed psychomotor activity. Speech somewhat slowed but easy to understand. Thoughts lucid and directed but slow. No evidence of thought disorder or loosening of associations. Affect slightly blunted. Mood stated as better. Denied suicidal or homicidal ideation. Shows improved judgment and insight.   DIAGNOSES PRINCIPLE AND PRIMARY:  AXIS I: Major depressive episode, recurrent, severe.   SECONDARY DIAGNOSES:  AXIS I:  1. Anxiety disorder, not otherwise specified.  2. Rule out OCD.   AXIS II: Deferred.   AXIS III: Gastric reflux symptoms.   AXIS IV: Moderate to severe stress from acute burden of illness causing inability to work and financial difficulties.   AXIS V: Functioning at time of discharge was 50.   ____________________________ Audery Amel, MD jtc:drc D: 03/09/2012 15:12:07 ET T: 03/09/2012 15:38:39 ET JOB#: [id number]  cc: Audery Amel, MD, <Dictator> [facility]  Loletha Carrow CLAPACS MD ELECTRONICALLY SIGNED 03/12/2012 11:46

## 2016-03-30 ENCOUNTER — Ambulatory Visit
Admission: EM | Admit: 2016-03-30 | Discharge: 2016-03-30 | Disposition: A | Discharge status: Home / Self Care | Payer: Worker's Compensation | Attending: Family Medicine | Admitting: Family Medicine

## 2016-03-30 ENCOUNTER — Ambulatory Visit (INDEPENDENT_AMBULATORY_CARE_PROVIDER_SITE_OTHER): Payer: Worker's Compensation

## 2016-03-30 ENCOUNTER — Encounter: Payer: Self-pay | Admitting: Emergency Medicine

## 2016-03-30 DIAGNOSIS — M7701 Medial epicondylitis, right elbow: Secondary | ICD-10-CM

## 2016-03-30 HISTORY — DX: Cerebral infarction, unspecified: I63.9

## 2016-03-30 MED ORDER — HYDROCODONE-ACETAMINOPHEN 5-325 MG PO TABS: ORAL_TABLET | ORAL | Status: DC

## 2016-03-30 NOTE — Discharge Instructions (Signed)
Medial Epicondylitis With Rehab Medial epicondylitis involves inflammation and pain around the inner (medial) portion of the elbow. This pain is caused by inflammation of the tendons in the forearm that flex (bring down) the wrist. Medial epicondylitis is also called golfer's elbow, because it is common among golfers. However, it may occur in any individual who flexes the wrist regularly. If medial epicondylitis is left untreated, it may become a chronic problem. SYMPTOMS   Pain, tenderness, or inflammation over the inner (medial) side of the elbow.  Pain or weakness with gripping activities.  Pain that increases with wrist twisting motions (using a screwdriver, playing golf, bowling). CAUSES  Medial epicondylitis is caused by inflammation of the tendons that flex the wrist. Causes of injury may include:  Chronic, repetitive stress and strain to the tendons that run from the wrist and forearm to the elbow.  Sudden strain on the forearm, including wrist snap when serving balls with racquet sports, or throwing a baseball. RISK INCREASES WITH:  Sports or occupations that require repetitive and/or strenuous forearm and wrist movements (pitching a baseball, golfing, carpentry).  Poor wrist and forearm strength and flexibility.  Failure to warm up properly before activity.  Resuming activity before healing, rehabilitation, and conditioning are complete. PREVENTION   Warm up and stretch properly before activity.  Maintain physical fitness:  Strength, flexibility, and endurance.  Cardiovascular fitness.  Wear and use properly fitted equipment.  Learn and use proper technique and have a coach correct improper technique.  Wear a tennis elbow (counterforce) brace. PROGNOSIS  The course of this condition depends on the degree of the injury. If treated properly, acute cases (symptoms lasting less than 4 weeks) are often resolved in 2 to 6 weeks. Chronic (longer lasting cases) often resolve  in 3 to 6 months, but may require physical therapy. RELATED COMPLICATIONS   Frequently recurring symptoms, resulting in a chronic problem. Properly treating the problem the first time decreases frequency of recurrence.  Chronic inflammation, scarring, and partial tendon tear, requiring surgery.  Delayed healing or resolution of symptoms. TREATMENT  Treatment first involves the use of ice and medicine, to reduce pain and inflammation. Strengthening and stretching exercises may reduce discomfort, if performed regularly. These exercises may be performed at home, if the condition is an acute injury. Chronic cases may require a referral to a physical therapist for evaluation and treatment. Your caregiver may advise a corticosteroid injection to help reduce inflammation. Rarely, surgery is needed. MEDICATION  If pain medicine is needed, nonsteroidal anti-inflammatory medicines (aspirin and ibuprofen), or other minor pain relievers (acetaminophen), are often advised.  Do not take pain medicine for 7 days before surgery.  Prescription pain relievers may be given, if your caregiver thinks they are needed. Use only as directed and only as much as you need.  Corticosteroid injections may be recommended. These injections should be reserved only for the most severe cases, because they can only be given a certain number of times. HEAT AND COLD  Cold treatment (icing) should be applied for 10 to 15 minutes every 2 to 3 hours for inflammation and pain, and immediately after activity that aggravates your symptoms. Use ice packs or an ice massage.  Heat treatment may be used before performing stretching and strengthening activities prescribed by your caregiver, physical therapist, or athletic trainer. Use a heat pack or a warm water soak. SEEK MEDICAL CARE IF: Symptoms get worse or do not improve in 2 weeks, despite treatment. EXERCISES  RANGE OF MOTION (ROM) AND  STRETCHING EXERCISES - Epicondylitis, Medial  (Golfer's Elbow) These exercises may help you when beginning to rehabilitate your injury. Your symptoms may go away with or without further involvement from your physician, physical therapist or athletic trainer. While completing these exercises, remember:   Restoring tissue flexibility helps normal motion to return to the joints. This allows healthier, less painful movement and activity.  An effective stretch should be held for at least 30 seconds.  A stretch should never be painful. You should only feel a gentle lengthening or release in the stretched tissue. RANGE OF MOTION - Wrist Flexion, Active-Assisted  Extend your right / left elbow with your fingers pointing down.*  Gently pull the back of your hand towards you, until you feel a gentle stretch on the top of your forearm.  Hold this position for __________ seconds. Repeat __________ times. Complete this exercise __________ times per day.  *If directed by your physician, physical therapist or athletic trainer, complete this stretch with your elbow bent, rather than extended. RANGE OF MOTION - Wrist Extension, Active-Assisted  Extend your right / left elbow and turn your palm upwards.*  Gently pull your palm and fingertips back, so your wrist extends and your fingers point more toward the ground.  You should feel a gentle stretch on the inside of your forearm.  Hold this position for __________ seconds. Repeat __________ times. Complete this exercise __________ times per day. *If directed by your physician, physical therapist or athletic trainer, complete this stretch with your elbow bent, rather than extended. STRETCH - Wrist Extension   Place your right / left fingertips on a tabletop leaving your elbow slightly bent. Your fingers should point backwards.  Gently press your fingers and palm down onto the table, by straightening your elbow. You should feel a stretch on the inside of your forearm.  Hold this position for  __________ seconds. Repeat __________ times. Complete this stretch __________ times per day.  STRENGTHENING EXERCISES - Epicondylitis, Medial (Golfer's Elbow) These exercises may help you when beginning to rehabilitate your injury. They may resolve your symptoms with or without further involvement from your physician, physical therapist or athletic trainer. While completing these exercises, remember:   Muscles can gain both the endurance and the strength needed for everyday activities through controlled exercises.  Complete these exercises as instructed by your physician, physical therapist or athletic trainer. Increase the resistance and repetitions only as guided.  You may experience muscle soreness or fatigue, but the pain or discomfort you are trying to eliminate should never worsen during these exercises. If this pain does get worse, stop and make sure you are following the directions exactly. If the pain is still present after adjustments, discontinue the exercise until you can discuss the trouble with your caregiver. STRENGTH - Wrist Flexors  Sit with your right / left forearm palm-up, and fully supported on a table or countertop. Your elbow should be resting below the height of your shoulder. Allow your wrist to extend over the edge of the surface.  Loosely holding a __________ weight, or a piece of rubber exercise band or tubing, slowly curl your hand up toward your forearm.  Hold this position for __________ seconds. Slowly lower the wrist back to the starting position in a controlled manner. Repeat __________ times. Complete this exercise __________ times per day.  STRENGTH - Wrist Extensors  Sit with your right / left forearm palm-down and fully supported. Your elbow should be resting below the height of your shoulder. Allow your   wrist to extend over the edge of the surface.  Loosely holding a __________ weight, or a piece of rubber exercise band or tubing, slowly curl your hand up  toward your forearm.  Hold this position for __________ seconds. Slowly lower the wrist back to the starting position in a controlled manner. Repeat __________ times. Complete this exercise __________ times per day.  STRENGTH - Ulnar Deviators  Stand with a ____________________ weight in your right / left hand, or sit while holding a rubber exercise band or tubing, with your healthy arm supported on a table or countertop.  Move your wrist so that your pinkie travels toward your forearm and your thumb moves away from your forearm.  Hold this position for __________ seconds and then slowly lower the wrist back to the starting position. Repeat __________ times. Complete this exercise __________ times per day STRENGTH - Grip   Grasp a tennis ball, a dense sponge, or a large, rolled sock in your hand.  Squeeze as hard as you can, without increasing any pain.  Hold this position for __________ seconds. Release your grip slowly. Repeat __________ times. Complete this exercise __________ times per day.  STRENGTH - Forearm Supinators   Sit with your right / left forearm supported on a table, keeping your elbow below shoulder height. Rest your hand over the edge, palm down.  Gently grip a hammer or a soup ladle.  Without moving your elbow, slowly turn your palm and hand upward to a "thumbs-up" position.  Hold this position for __________ seconds. Slowly return to the starting position. Repeat __________ times. Complete this exercise __________ times per day.  STRENGTH - Forearm Pronators  Sit with your right / left forearm supported on a table, keeping your elbow below shoulder height. Rest your hand over the edge, palm up.  Gently grip a hammer or a soup ladle.  Without moving your elbow, slowly turn your palm and hand upward to a "thumbs-up" position.  Hold this position for __________ seconds. Slowly return to the starting position. Repeat __________ times. Complete this exercise  __________ times per day.    This information is not intended to replace advice given to you by your health care provider. Make sure you discuss any questions you have with your health care provider.   Document Released: 12/12/2005 Document Revised: 01/02/2015 Document Reviewed: 03/26/2009 Elsevier Interactive Patient Education 2016 [employer]  Tendinitis Tendinitis is swelling and inflammation of the tendons. Tendons are band-like tissues that connect muscle to bone. Tendinitis commonly occurs in the:   Shoulders (rotator cuff).  Heels (Achilles tendon).  Elbows (triceps tendon). CAUSES Tendinitis is usually caused by overusing the tendon, muscles, and joints involved. When the tissue surrounding a tendon (synovium) becomes inflamed, it is called tenosynovitis. Tendinitis commonly develops in people whose jobs require repetitive motions. SYMPTOMS  Pain.  Tenderness.  Mild swelling. DIAGNOSIS Tendinitis is usually diagnosed by physical exam. Your health care provider may also order X-rays or other imaging tests. TREATMENT Your health care provider may recommend certain medicines or exercises for your treatment. HOME CARE INSTRUCTIONS   Use a sling or splint for as long as directed by your health care provider until the pain decreases.  Put ice on the injured area.  Put ice in a plastic bag.  Place a towel between your skin and the bag.  Leave the ice on for 15-20 minutes, 3-4 times a day, or as directed by your health care provider.  Avoid using the limb while the  tendon is painful. Perform gentle range of motion exercises only as directed by your health care provider. Stop exercises if pain or discomfort increase, unless directed otherwise by your health care provider.  Only take over-the-counter or prescription medicines for pain, discomfort, or fever as directed by your health care provider. SEEK MEDICAL CARE IF:   Your pain and swelling increase.  You develop  new, unexplained symptoms, especially increased numbness in the hands. MAKE SURE YOU:   Understand these instructions.  Will watch your condition.  Will get help right away if you are not doing well or get worse.   This information is not intended to replace advice given to you by your health care provider. Make sure you discuss any questions you have with your health care provider.   Document Released: 12/09/2000 Document Revised: 01/02/2015 Document Reviewed: 02/28/2011 Elsevier Interactive Patient Education [employer]

## 2016-03-30 NOTE — ED Notes (Signed)
Patient states he has been having right elbow pain for the last several months, states it is increasingly more painful

## 2016-03-30 NOTE — ED Provider Notes (Signed)
CSN: [id number]     Arrival date & time 03/30/16  0846 History   First MD Initiated Contact with Patient 03/30/16 [phone]     Chief Complaint  Patient presents with  . Elbow Pain   (Consider location/radiation/quality/duration/timing/severity/associated sxs/prior Treatment) HPI Comments: 50 yo male with a c/o right elbow pain that has developed over the last 2 months or so, since he started doing more work involving grasping and gripping to buff and tighten pieces at work. Denies any falls or direct trauma injuries.   The history is provided by the patient.    Past Medical History  Diagnosis Date  . Stroke [facility](HCC)    Past Surgical History  Procedure Laterality Date  . Cholecystectomy    . Hernia repair     History reviewed. No pertinent family history. Social History  Substance Use Topics  . Smoking status: Never Smoker   . Smokeless tobacco: Current User  . Alcohol Use: Yes    Review of Systems  Allergies  Review of patient's allergies indicates not on file.  Home Medications   Prior to Admission medications   Medication Sig Start Date End Date Taking? Authorizing Provider  buPROPion (WELLBUTRIN) 100 MG tablet Take 300 mg by mouth once.   Yes Historical Provider, MD  FLUoxetine (PROZAC) 40 MG capsule Take 40 mg by mouth daily.   Yes Historical Provider, MD  lithium 300 MG tablet Take 300 mg by mouth 3 (three) times daily.   Yes Historical Provider, MD  omeprazole (PRILOSEC) 10 MG capsule Take 10 mg by mouth daily.   Yes Historical Provider, MD  pravastatin (PRAVACHOL) 10 MG tablet Take 10 mg by mouth daily.   Yes Historical Provider, MD  HYDROcodone-acetaminophen (NORCO/VICODIN) 5-325 MG tablet 1-2 tabs po qd prn 03/30/16   Payton Mccallumrlando Ercia Crisafulli, MD   Meds Ordered and Administered this Visit  Medications - No data to display  BP 115/79 mmHg  Pulse 62  Temp(Src) 98.2 F (36.8 C) (Oral)  Resp 18  Ht 5\' 11"  (1.803 m)  Wt 203 lb (92.08 kg)  BMI 28.33 kg/m2  SpO2 99% No data  found.   Physical Exam  Constitutional: He appears well-developed and well-nourished. No distress.  Musculoskeletal:       Right elbow: He exhibits normal range of motion, no swelling, no effusion, no deformity and no laceration. Tenderness found. Medial epicondyle tenderness noted.  Skin: No rash noted. He is not diaphoretic.  Nursing note and vitals reviewed.   ED Course  Procedures (including critical care time)  Labs Review Labs Reviewed - No data to display  Imaging Review Dg Elbow Complete Right  03/30/2016  CLINICAL DATA:  Elbow pain for 2 months, no acute trauma EXAM: RIGHT ELBOW - COMPLETE 3+ VIEW COMPARISON:  None. FINDINGS: No acute fracture is seen. Alignment is normal. Along the lateral epicondylar surface there is faint calcification on one view which could be related to calcific tenosynovitis. No joint effusion is seen. IMPRESSION: No acute abnormality. No joint effusion. Faint soft tissue calcification along the lateral epicondyle may indicate calcific tenosynovitis. Electronically Signed   By: Dwyane DeePaul  Barry M.D.   On: 03/30/2016 10:02     Visual Acuity Review  Right Eye Distance:   Left Eye Distance:   Bilateral Distance:    Right Eye Near:   Left Eye Near:    Bilateral Near:         MDM   1. Medial epicondylitis, right    Discharge Medication List as of  03/30/2016 10:13 AM    START taking these medications   Details  HYDROcodone-acetaminophen (NORCO/VICODIN) 5-325 MG tablet 1-2 tabs po qd prn, Print       1. x-ray results and diagnosis reviewed with patient 2. rx as per orders above; reviewed possible side effects, interactions, risks and benefits  3. Recommend supportive treatment with otc NSAIDS, ice, rest 4. Follow-up at [facility], [facility] [place] in 1 week   Payton Mccallum, [place] 03/30/16 [phone]

## 2016-05-25 ENCOUNTER — Encounter (INDEPENDENT_AMBULATORY_CARE_PROVIDER_SITE_OTHER): Payer: Self-pay

## 2016-05-25 ENCOUNTER — Ambulatory Visit (INDEPENDENT_AMBULATORY_CARE_PROVIDER_SITE_OTHER): Payer: No Typology Code available for payment source | Admitting: Cardiology

## 2016-05-25 VITALS — BP 110/62 | HR 59 | Ht 71.0 in | Wt 204.5 lb

## 2016-05-25 DIAGNOSIS — Z8673 Personal history of transient ischemic attack (TIA), and cerebral infarction without residual deficits: Secondary | ICD-10-CM | POA: Diagnosis not present

## 2016-05-25 NOTE — Patient Instructions (Signed)
Medication Instructions:  Your physician recommends that you continue on your current medications as directed. Please refer to the Current Medication list given to you today.   Labwork: none  Testing/Procedures: none  Follow-Up: Your physician recommends that you schedule a follow-up appointment as needed with Dr. Harding   Any Other Special Instructions Will Be Listed Below (If Applicable).     If you need a refill on your cardiac medications before your next appointment, please call your pharmacy.   

## 2016-05-25 NOTE — Progress Notes (Signed)
PATIENT: Ryan Gomez MRN: [id number] DOB: 1966/08/23 PCP: [facility]  Clinic Note: Chief Complaint  Patient presents with  . other    Ref by the [facility] medical center for a history of stroke and discuss if had possible a-fib/flutter that caused him to have the stroke. Meds reviewed by the patient verbally.     HPI: Ryan Gomez is a 50 y.o. male with a PMH below who presents today for Cardiology Consult with questionable atrial fibrillation/flutter. He was referred by [facility] following a stroke - this occurred September 2015.  He admits having delayed establishing follow-up. He thinks he had a neck are Gram performed but is not sure. He also wore a Holter monitor that did not show any arrhythmias..  Interval History: Mr. Ryan Gomez is a very pleasant gentleman without really much significant history other than hyperlipidemia who presents here for cardiology evaluation several years after a stroke episode. The main symptom he felt was some vision issues. He had loss of vision in his right eye that is now almost all but cleared up. He denies having any sensation of rapid or irregular heartbeats. No recurrent episodes of TIA/amaurosis fugax in the last 6 months. 6 months ago he had an episode of diplopia and went to the emergency room. The MRI there did not show anything new.  From a cardiac standpoint, he really doesn't have many risk factors other than hyperlipidemia.  No history of hypertension.  He is been present astigmatic for cardiac standpoint. Cardiac review of symptoms as follows: positive for - He does get short of breath every several flights of steps. But usually walk works out of the gym 4-5 days a week for about an hour doing stair climber's etc. negative for - chest pain, edema, irregular heartbeat, loss of consciousness, murmur, orthopnea, palpitations, paroxysmal nocturnal dyspnea, rapid heart rate, shortness of breath or Syncope/near syncope or  TIA/amaurosis fugax :   Past Medical History  Diagnosis Date  . 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 - He actually went to the emergency room about a week after onset of symptoms. - Reportedly had a normal echocardiogram and 48-hour monitor.  . Bipolar affective disorder (HCC)   . Hyperlipidemia     Prior Cardiac Evaluation and Past Surgical History: Past Surgical History  Procedure Laterality Date  . Cholecystectomy    . Hernia repair      No Known Allergies  Current Outpatient Prescriptions  Medication Sig Dispense Refill  . buPROPion (WELLBUTRIN) 100 MG tablet Take 300 mg by mouth once.    Marland Kitchen FLUoxetine (PROZAC) 40 MG capsule Take 40 mg by mouth daily.    Marland Kitchen HYDROcodone-acetaminophen (NORCO/VICODIN) 5-325 MG tablet 1-2 tabs po qd prn 8 tablet 0  . lithium 300 MG tablet Take 300 mg by mouth 3 (three) times daily.    Marland Kitchen omeprazole (PRILOSEC) 10 MG capsule Take 10 mg by mouth daily.    . pravastatin (PRAVACHOL) 10 MG tablet Take 10 mg by mouth daily.     No current facility-administered medications for this visit.   Social History   Social History  . Marital Status: Married    Spouse Name: N/A  . Number of Children: N/A  . Years of Education: N/A   Social History Main Topics  . Smoking status: Never Smoker   . Smokeless tobacco: Current User  . Alcohol Use: Yes  . Drug Use: No  . Sexual Activity: Not Asked   Other Topics  Concern  . None   Social History Narrative    family history includes Diabetes in his daughter; Stroke in his paternal grandmother.  ROS: A comprehensive Review of Systems - Was performed Review of Systems  Constitutional: Negative for malaise/fatigue.  HENT: Negative for congestion and nosebleeds.   Eyes: Negative for double vision (Not in > 6 months).  Respiratory: Negative for cough, shortness of breath and wheezing.   Gastrointestinal: Positive for heartburn. Negative for abdominal pain, blood in stool and melena.  Genitourinary:  Negative for hematuria.  Musculoskeletal: Negative for myalgias, back pain and falls.  Skin: Negative for rash.  Neurological: Negative for headaches.  Endo/Heme/Allergies: Does not bruise/bleed easily.  Psychiatric/Behavioral: Negative for memory loss. The patient is nervous/anxious. The patient does not have insomnia.        Bipolar / PTSD pretty well controlled.  All other systems reviewed and are negative.   PHYSICAL EXAM BP 110/62 mmHg  Pulse 59  Ht 5\' 11"  (1.803 m)  Wt 204 lb 8 oz (92.761 kg)  BMI 28.53 kg/m2 General appearance: alert, cooperative, appears stated age, no distress and Healthy-appearing. Well-nourished well-groomed. HEENT: [place]/AT, EOMI, MMM, anicteric sclera Neck: no adenopathy, no carotid bruit, no JVD, supple, symmetrical, trachea midline and thyroid not enlarged, symmetric, no tenderness/mass/nodules Lungs: clear to auscultation bilaterally, normal percussion bilaterally and Nonlabored, good air movement Heart: regular rate and rhythm, S1, S2 normal, no murmur, click, rub or gallop and normal apical impulse Abdomen: soft, non-tender; bowel sounds normal; no masses,  no organomegaly Extremities: extremities normal, atraumatic, no cyanosis or edema Pulses: 2+ and symmetric Skin: Skin color, texture, turgor normal. No rashes or lesions Neurologic: Alert and oriented X 3, normal strength and tone. Normal symmetric reflexes. Normal coordination and gait   Adult ECG Report  Rate: 56 ;  Rhythm: sinus bradycardia and Borderline 1 AVB (PR interval 198). Borderline left axis deviation (-25). Otherwise normal axis, intervals and durations.  Narrative Interpretation: Relatively normal EKG. No acute findings.  Recent Labs: None available  ASSESSMENT / PLAN: Ryan Gomez is now here almost 2 years out from his cryptogenic stroke for cardiac evaluation. In this 2 year. He has not had any episodes of rapid irregular heartbeats or palpitations to suggest that he would've  had atrial flutter ablation of flutter. I don't know what benefit we would derive from a cardiac evaluation at this point. He would've artery had things like carotid Dopplers and echocardiogram done at that time that were ostensibly normal. As far as any atrial fibrillation or flutter, I do not think that we would derive any benefit from having him wear a monitor for 48 hours or even one month in the absence of him having symptoms that are concerning. If he does have some palpitations that are somewhat concerning, then I think it will be reasonable to have him wear a monitor. An implantable recorder would only be helpful for you to have another event. At this point I think the best option is to continue aspirin and statin along with blood pressure control.  Problem List Items Addressed This Visit    History of stroke - Primary (Chronic)    Delayed presentation with stroke symptoms and now delayed presentation for cardiology evaluation. At this point I don't think he would benefit from rechecking a monitor or echocardiogram. If he has palpitation symptoms that indeed we should get a 48 hour work 1 month monitor. Otherwise I would continue to treat his cholesterol levels and have him on aspirin.  Relevant Orders   EKG 12-Lead (Completed)      No orders of the defined types were placed in this encounter.    Followup: When necessary     Marykay Lex, M.D., M.S.  [place]  [address] [place], [place] [address] [phone] Fax [phone]

## 2016-05-27 ENCOUNTER — Encounter: Payer: Self-pay | Admitting: Cardiology

## 2016-05-27 DIAGNOSIS — Z8673 Personal history of transient ischemic attack (TIA), and cerebral infarction without residual deficits: Secondary | ICD-10-CM | POA: Insufficient documentation

## 2016-05-27 NOTE — Assessment & Plan Note (Signed)
Delayed presentation with stroke symptoms and now delayed presentation for cardiology evaluation. At this point I don't think he would benefit from rechecking a monitor or echocardiogram. If he has palpitation symptoms that indeed we should get a 48 hour work 1 month monitor. Otherwise I would continue to treat his cholesterol levels and have him on aspirin.

## 2016-06-13 ENCOUNTER — Emergency Department
Admission: EM | Admit: 2016-06-13 | Discharge: 2016-06-13 | Disposition: A | Discharge status: Home / Self Care | Payer: 59 | Attending: Emergency Medicine | Admitting: Emergency Medicine

## 2016-06-13 DIAGNOSIS — Y999 Unspecified external cause status: Secondary | ICD-10-CM | POA: Diagnosis not present

## 2016-06-13 DIAGNOSIS — E785 Hyperlipidemia, unspecified: Secondary | ICD-10-CM | POA: Insufficient documentation

## 2016-06-13 DIAGNOSIS — S51852A Open bite of left forearm, initial encounter: Secondary | ICD-10-CM | POA: Diagnosis present

## 2016-06-13 DIAGNOSIS — F319 Bipolar disorder, unspecified: Secondary | ICD-10-CM | POA: Diagnosis not present

## 2016-06-13 DIAGNOSIS — Y929 Unspecified place or not applicable: Secondary | ICD-10-CM | POA: Insufficient documentation

## 2016-06-13 DIAGNOSIS — W540XXA Bitten by dog, initial encounter: Secondary | ICD-10-CM | POA: Diagnosis not present

## 2016-06-13 DIAGNOSIS — Z8673 Personal history of transient ischemic attack (TIA), and cerebral infarction without residual deficits: Secondary | ICD-10-CM | POA: Diagnosis not present

## 2016-06-13 DIAGNOSIS — Y939 Activity, unspecified: Secondary | ICD-10-CM | POA: Insufficient documentation

## 2016-06-13 MED ORDER — AMOXICILLIN-POT CLAVULANATE 875-125 MG PO TABS: 1.0000 | ORAL_TABLET | Freq: Two times a day (BID) | ORAL | Status: AC

## 2016-06-13 MED ORDER — TETANUS-DIPHTH-ACELL PERTUSSIS 5-2.5-18.5 LF-MCG/0.5 IM SUSP
0.5000 mL | Freq: Once | INTRAMUSCULAR | Status: AC
  Administered 2016-06-13: 0.5 mL via INTRAMUSCULAR
  Filled 2016-06-13: qty 0.5

## 2016-06-13 NOTE — Discharge Instructions (Signed)
Animal Bite Animal bite wounds can get infected. It is important to get proper medical treatment. Ask your doctor if you need rabies treatment. HOME CARE  Wound Care  Follow instructions from your doctor about how to take care of your wound. Make sure you:  Wash your hands with soap and water before you change your bandage (dressing). If you cannot use soap and water, use hand sanitizer.  Change your bandage as told by your doctor.  Leave stitches (sutures), skin glue, or skin tape (adhesive) strips in place. They may need to stay in place for 2 weeks or longer. If tape strips get loose and curl up, you may trim the loose edges. Do not remove tape strips completely unless your doctor says it is okay.  Check your wound every day for signs of infection. Watch for:    Redness, swelling, or pain that gets worse.    Fluid, blood, or pus.  General Instructions  Take or apply over-the-counter and prescription medicines only as told by your doctor.   If you were prescribed an antibiotic, take or apply it as told by your doctor. Do not stop using the antibiotic even if your condition improves.   Keep the injured area raised (elevated) above the level of your heart while you are sitting or lying down.  If directed, apply ice to the injured area.    Put ice in a plastic bag.    Place a towel between your skin and the bag.    Leave the ice on for 20 minutes, 2-3 times per day.   Keep all follow-up visits as told by your doctor. This is important.  GET HELP IF:  You have redness, swelling, or pain that gets worse.   You have a general feeling of sickness (malaise).   You feel sick to your stomach (nauseous).  You throw up (vomit).   You have pain that does not get better.  GET HELP RIGHT AWAY IF:   You have a red streak going away from your wound.   You have fluid, blood, or pus coming from your wound.   You have a fever or chills.   You have trouble  moving your injured area.   You have numbness or tingling anywhere on your body.    This information is not intended to replace advice given to you by your health care provider. Make sure you discuss any questions you have with your health care provider.   Document Released: 12/12/2005 Document Revised: 09/02/2015 Document Reviewed: 04/29/2015 Elsevier Interactive Patient Education 2016 [employer]   [place] area twice a day with mild soap and water. Watch for signs of continued infection. [facility] or return to the emergency room if any severe worsening, fever, chills, marked drainage from the site. Take antibiotics until completely finished. He may also take Tylenol or ibuprofen if needed for pain.

## 2016-06-13 NOTE — ED Notes (Signed)
Pt reports he was bit yesterday by his own dog to his left forearm. Pt reports soreness to area. Pt reports dogs shots are UTD. Pts tetanus is not UTD.

## 2016-06-13 NOTE — ED Notes (Signed)
Pt states he was at the vet with his dogs and they started barking and he stuck his arm out to stop them and was bitten on the left FA.

## 2016-06-13 NOTE — ED Provider Notes (Signed)
[facility] Emergency Department Provider Note  ____________________________________________  Time seen: Approximately 11:46 AM  I have reviewed the triage vital signs and the nursing notes.   HISTORY  Chief Complaint Animal Bite   HPI Ryan Gomez is a 50 y.o. male who presents to the emergency department with a dog bite. 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 He reached towards the dogs and was bitten on his left forearm by his dog. The dog is up to date on all of his vaccinations, including rabies. Patient states that the veterinarian put some "blue antiseptic liquid" on the bite and recommended that he be seen by a doctor. The patient went home and cleaned the wound with rubbing alcohol and applied triple antibiotic cream. When he awoke this morning, he noticed that the bite was slightly red, warm, and mildly swollen. He denies discharge or any limitation or pain with ROM at the left wrist or elbow. He states that he cannot remember when his last tetanus shot/booster was, but knows it was more that 5 years ago. He rates his pain as 4/10.    Past Medical History  Diagnosis Date  . 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 - He actually went to the emergency room about a week after onset of symptoms. - Reportedly had a normal echocardiogram and 48-hour monitor.  . Bipolar affective disorder (HCC)   . Hyperlipidemia     Patient Active Problem List   Diagnosis Date Noted  . History of stroke 05/27/2016    Past Surgical History  Procedure Laterality Date  . Cholecystectomy    . Hernia repair      Current Outpatient Rx  Name  Route  Sig  Dispense  Refill  . amoxicillin-clavulanate (AUGMENTIN) 875-125 MG tablet   Oral   Take 1 tablet by mouth 2 (two) times daily.   20 tablet   0   . buPROPion (WELLBUTRIN) 100 MG tablet   Oral   Take  300 mg by mouth once.         Marland Kitchen FLUoxetine (PROZAC) 40 MG capsule   Oral   Take 40 mg by mouth daily.         Marland Kitchen HYDROcodone-acetaminophen (NORCO/VICODIN) 5-325 MG tablet      1-2 tabs po qd prn   8 tablet   0   . lithium 300 MG tablet   Oral   Take 300 mg by mouth 3 (three) times daily.         Marland Kitchen omeprazole (PRILOSEC) 10 MG capsule   Oral   Take 10 mg by mouth daily.         . pravastatin (PRAVACHOL) 10 MG tablet   Oral   Take 10 mg by mouth daily.           Allergies Review of patient's allergies indicates no known allergies.  Family History  Problem Relation Age of Onset  . Stroke Paternal Grandmother   . Diabetes Daughter     Social History Social History  Substance Use Topics  . Smoking status: Never Smoker   . Smokeless tobacco: Current User  . Alcohol Use: Yes    Review of Systems Constitutional: No fever/chills Cardiovascular: Denies chest pain. Respiratory: Denies shortness of breath. Gastrointestinal:  No nausea, no vomiting.  Musculoskeletal: Negative for back pain. Positive left forearm pain. Skin: Positive dog bite left forearm. Neurological:  Negative for headaches, focal weakness or numbness.  10-point ROS otherwise negative.  ____________________________________________   PHYSICAL EXAM:  VITAL SIGNS: ED Triage Vitals  Enc Vitals Group     BP 06/13/16 1126 150/98 mmHg     Pulse Rate 06/13/16 1126 72     Resp 06/13/16 1126 17     Temp 06/13/16 1126 98.2 F (36.8 C)     Temp Source 06/13/16 1126 Oral     SpO2 06/13/16 1126 97 %     Weight 06/13/16 1126 210 lb (95.255 kg)     Height 06/13/16 1126 5\' 11"  (1.803 m)     Head Cir --      Peak Flow --      Pain Score 06/13/16 1127 4     Pain Loc --      Pain Edu? --      Excl. in GC? --     Constitutional: Alert and oriented. Well appearing and in no acute distress. Eyes: Conjunctivae are normal. PERRL. EOMI. Head: Atraumatic. Nose: No congestion/rhinnorhea. Neck: No  stridor.   Cardiovascular: Normal rate, regular rhythm. Grossly normal heart sounds.  Good peripheral circulation. Respiratory: Normal respiratory effort.  No retractions. Lungs CTAB. Musculoskeletal: Moves upper and lower extremities without any difficulty. There is some tenderness on palpation of the proximal left forearm/elbow area secondary to soft tissue edema. There appears to be superficial of patient's/teeth marks present. There is no evidence of infection or drainage from the area. Area is moderately tender to palpation. No injury distal to the dog bite was seen. Neurologic:  Normal speech and language. No gross focal neurologic deficits are appreciated. No gait instability. Skin:  Skin is warm, dry and intact. As above Psychiatric: Mood and affect are normal. Speech and behavior are normal.  ____________________________________________   LABS (all labs ordered are listed, but only abnormal results are displayed)  Labs Reviewed - No data to display  PROCEDURES  Procedure(s) performed: None  Critical Care performed: No  ____________________________________________   INITIAL IMPRESSION / ASSESSMENT AND PLAN / ED COURSE  Pertinent labs & imaging results that were available during my care of the patient were reviewed by me and considered in my medical decision making (see chart for details).  Patient was given a Tdap In the emergency room along with prescription for Augmentin 875 twice a day for 10 days. Patient is also encouraged to wash the area with mild soap and  water twice a day and continue watching for signs of infection. He is follow-up with his PCP or the clinic if any continued problems or return to the emergency room if any severe worsening of his symptoms. ____________________________________________   FINAL CLINICAL IMPRESSION(S) / ED DIAGNOSES  Final diagnoses:  Dog bite of left forearm, initial encounter      NEW MEDICATIONS STARTED DURING THIS  VISIT:  Discharge Medication List as of 06/13/2016 12:31 PM    START taking these medications   Details  amoxicillin-clavulanate (AUGMENTIN) 875-125 MG tablet Take 1 tablet by mouth 2 (two) times daily., Starting 06/13/2016, Until Mon 06/20/16, Print         Note:  This document was prepared using Dragon voice recognition software and may include unintentional dictation errors.    Tommi RumpsRhonda L Miette Molenda, PA-C 06/13/16 1719  Sharman CheekPhillip Stafford, MD 06/14/16 2014

## 2018-01-06 ENCOUNTER — Other Ambulatory Visit: Payer: Self-pay

## 2018-01-06 ENCOUNTER — Emergency Department
Admission: EM | Admit: 2018-01-06 | Discharge: 2018-01-08 | Disposition: A | Discharge status: Other Acute Inpatient Hospital | Payer: 59 | Attending: Emergency Medicine | Admitting: Emergency Medicine

## 2018-01-06 ENCOUNTER — Encounter: Payer: Self-pay | Admitting: Emergency Medicine

## 2018-01-06 DIAGNOSIS — F319 Bipolar disorder, unspecified: Secondary | ICD-10-CM | POA: Insufficient documentation

## 2018-01-06 DIAGNOSIS — Z79899 Other long term (current) drug therapy: Secondary | ICD-10-CM | POA: Diagnosis not present

## 2018-01-06 DIAGNOSIS — Y904 Blood alcohol level of 80-99 mg/100 ml: Secondary | ICD-10-CM | POA: Insufficient documentation

## 2018-01-06 DIAGNOSIS — F10929 Alcohol use, unspecified with intoxication, unspecified: Secondary | ICD-10-CM | POA: Insufficient documentation

## 2018-01-06 DIAGNOSIS — F17290 Nicotine dependence, other tobacco product, uncomplicated: Secondary | ICD-10-CM | POA: Diagnosis not present

## 2018-01-06 DIAGNOSIS — R45851 Suicidal ideations: Secondary | ICD-10-CM | POA: Diagnosis not present

## 2018-01-06 DIAGNOSIS — Z8673 Personal history of transient ischemic attack (TIA), and cerebral infarction without residual deficits: Secondary | ICD-10-CM | POA: Diagnosis not present

## 2018-01-06 DIAGNOSIS — F329 Major depressive disorder, single episode, unspecified: Secondary | ICD-10-CM | POA: Diagnosis not present

## 2018-01-06 LAB — URINE DRUG SCREEN, QUALITATIVE (ARMC ONLY)
Amphetamines, Ur Screen: NOT DETECTED
Barbiturates, Ur Screen: NOT DETECTED
Benzodiazepine, Ur Scrn: NOT DETECTED
Cannabinoid 50 Ng, Ur ~~LOC~~: NOT DETECTED
Cocaine Metabolite,Ur ~~LOC~~: NOT DETECTED
MDMA (ECSTASY) UR SCREEN: NOT DETECTED
METHADONE SCREEN, URINE: NOT DETECTED
Opiate, Ur Screen: NOT DETECTED
Phencyclidine (PCP) Ur S: NOT DETECTED
TRICYCLIC, UR SCREEN: NOT DETECTED

## 2018-01-06 LAB — CBC
HEMATOCRIT: 46.2 % (ref 40.0–52.0)
HEMOGLOBIN: 16.1 g/dL (ref 13.0–18.0)
MCH: 32 pg (ref 26.0–34.0)
MCHC: 34.7 g/dL (ref 32.0–36.0)
MCV: 92.2 fL (ref 80.0–100.0)
Platelets: 281 10*3/uL (ref 150–440)
RBC: 5.02 MIL/uL (ref 4.40–5.90)
RDW: 12.9 % (ref 11.5–14.5)
WBC: 7.1 10*3/uL (ref 3.8–10.6)

## 2018-01-06 LAB — COMPREHENSIVE METABOLIC PANEL
ALBUMIN: 4.9 g/dL (ref 3.5–5.0)
ALT: 37 U/L (ref 17–63)
AST: 34 U/L (ref 15–41)
Alkaline Phosphatase: 87 U/L (ref 38–126)
Anion gap: 9 (ref 5–15)
BUN: 12 mg/dL (ref 6–20)
CHLORIDE: 105 mmol/L (ref 101–111)
CO2: 26 mmol/L (ref 22–32)
CREATININE: 1.19 mg/dL (ref 0.61–1.24)
Calcium: 9.1 mg/dL (ref 8.9–10.3)
GFR calc Af Amer: >60 mL/min (ref >60)
GFR calc non Af Amer: >60 mL/min (ref >60)
GLUCOSE: 170 mg/dL — AB (ref 65–99)
Potassium: 4.1 mmol/L (ref 3.5–5.1)
SODIUM: 140 mmol/L (ref 135–145)
Total Bilirubin: 1.6 mg/dL — ABNORMAL HIGH (ref 0.3–1.2)
Total Protein: 8.1 g/dL (ref 6.5–8.1)

## 2018-01-06 LAB — ACETAMINOPHEN LEVEL

## 2018-01-06 LAB — ETHANOL: Alcohol, Ethyl (B): 85 mg/dL — ABNORMAL HIGH (ref <10)

## 2018-01-06 LAB — SALICYLATE LEVEL

## 2018-01-06 MED ORDER — FOLIC ACID 1 MG PO TABS
1.0000 mg | ORAL_TABLET | Freq: Every day | ORAL | Status: DC
  Administered 2018-01-06 – 2018-01-08 (×3): 1 mg via ORAL
  Filled 2018-01-06 (×3): qty 1

## 2018-01-06 MED ORDER — LORAZEPAM 1 MG PO TABS
1.0000 mg | ORAL_TABLET | Freq: Three times a day (TID) | ORAL | Status: DC | PRN
  Administered 2018-01-07 – 2018-01-08 (×3): 1 mg via ORAL
  Filled 2018-01-06 (×4): qty 1

## 2018-01-06 MED ORDER — VITAMIN B-1 100 MG PO TABS
100.0000 mg | ORAL_TABLET | Freq: Every day | ORAL | Status: DC
  Administered 2018-01-06 – 2018-01-08 (×3): 100 mg via ORAL
  Filled 2018-01-06 (×3): qty 1

## 2018-01-06 MED ORDER — PRAVASTATIN SODIUM 10 MG PO TABS
10.0000 mg | ORAL_TABLET | Freq: Every day | ORAL | Status: DC
  Administered 2018-01-06 – 2018-01-08 (×3): 10 mg via ORAL
  Filled 2018-01-06 (×3): qty 1

## 2018-01-06 NOTE — ED Notes (Signed)
Pt. Resting in room watching tv.  Pt. Asked "Will I be moving out of this room tonight".  Pt. Told h will probably not be moved tonight but will be informed if things change.  Pt. Requesting water to drink, pt. Given cup of ice with water.

## 2018-01-06 NOTE — ED Notes (Signed)
BEHAVIORAL HEALTH ROUNDING Patient sleeping: No. Patient alert and oriented: yes Behavior appropriate: Yes.  ; If no, describe:  Nutrition and fluids offered: Yes  Toileting and hygiene offered: Yes  Sitter present: not applicable Law enforcement present: Yes  

## 2018-01-06 NOTE — ED Notes (Addendum)
[facility] [place]

## 2018-01-06 NOTE — BH Assessment (Signed)
[facility] [place] to talk to A[facility] about mental health beds availability (diversion). Unable to reach anyone at this time ([phone] ext. [phone])

## 2018-01-06 NOTE — ED Triage Notes (Signed)
Patient to ER voluntarily with [employer] for c/o "having a hard time". Patient tearful in triage. Denies current SI, but states he was earlier today.

## 2018-01-06 NOTE — ED Notes (Signed)
SOC called for consult   1205

## 2018-01-06 NOTE — ED Notes (Signed)
Pt cooperative, but guarded with RN. When questioned as to why he was in the hospital pt laughed. "I guess my depression got worse after drinking."   Pt declined TV remote, given water as requested.  Pt denied SI presently. Denies HI and AVH. Denies pain. Maintained on 15 minute checks and observation by security camera for safety.

## 2018-01-06 NOTE — ED Notes (Signed)
Pt unhappy he is to be admitted for inpatient treatment.  Pt stated this unit will "make me more crazy."  RN explained the [facility] is a part of the ER and he would receive inpatient treatment on a different floor. Pt then stated, "I've been there before."  Pt has given his code to his wife. Maintained on 15 minute checks and observation by security camera for safety.

## 2018-01-06 NOTE — ED Notes (Signed)
Pt IVC/ pending placement  

## 2018-01-06 NOTE — ED Notes (Signed)

## 2018-01-06 NOTE — ED Notes (Signed)
Patient resting quietly in room. No noted distress or abnormal behaviors noted. Will continue 15 minute checks and observation by security camera for safety. 

## 2018-01-06 NOTE — ED Notes (Signed)
Patient states feels calm at present and not in need of ativan. No tremor noted.

## 2018-01-06 NOTE — ED Notes (Signed)
[facility]. Wife visiting with patient relations then will tx to [facility].

## 2018-01-06 NOTE — ED Provider Notes (Addendum)
[facility] Emergency Department Provider Note  Time seen: 11:39 AM  I have reviewed the triage vital signs and the nursing notes.   HISTORY  Chief Complaint Psychiatric Evaluation    HPI Ryan PortelaWilliam S Gomez is a 52 y.o. 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  According to the patient he drinks alcohol on a daily basis, drank heavily last night.  Today he was having thoughts of killing himself, states worsening depression but does not know why.  Patient denies any other substance use.  Patient states a history of attempting to hang himself in the past.   Past Medical History:  Diagnosis Date  . Bipolar affective disorder (HCC)   . Hyperlipidemia   . Stroke [facility]) September 2015   Cryptogenic. - He actually went to the emergency room about a week after onset of symptoms. - Reportedly had a normal echocardiogram and 48-hour monitor.    Patient Active Problem List   Diagnosis Date Noted  . History of stroke 05/27/2016    Past Surgical History:  Procedure Laterality Date  . CHOLECYSTECTOMY    . HERNIA REPAIR      Prior to Admission medications   Medication Sig Start Date End Date Taking? Authorizing Provider  buPROPion (WELLBUTRIN) 100 MG tablet Take 300 mg by mouth once.    [provider]  FLUoxetine (PROZAC) 40 MG capsule Take 40 mg by mouth daily.    [provider]  HYDROcodone-acetaminophen (NORCO/VICODIN) 5-325 MG tablet 1-2 tabs po qd prn 03/30/16   [name], [place], MD  lithium 300 MG tablet Take 300 mg by mouth 3 (three) times daily.    [provider]  omeprazole (PRILOSEC) 10 MG capsule Take 10 mg by mouth daily.    [provider]  pravastatin (PRAVACHOL) 10 MG tablet Take 10 mg by mouth daily.    [provider]    No Known Allergies  Family History  Problem Relation Age of Onset   . Stroke Paternal Grandmother   . Diabetes Daughter     Social History Social History   Tobacco Use  . Smoking status: Never Smoker  . Smokeless tobacco: Current User  Substance Use Topics  . Alcohol use: Yes  . Drug use: No    Review of Systems Constitutional: Negative for fever. Eyes: Negative for visual complaints ENT: Negative for congestion Cardiovascular: Negative for chest pain. Respiratory: Negative for shortness of breath. Gastrointestinal: Negative for abdominal pain.  Vomiting this morning. Genitourinary: Negative for dysuria. Musculoskeletal: Negative for back pain. Skin: Negative for rash. Neurological: Negative for headache All other ROS negative  ____________________________________________   PHYSICAL EXAM:  VITAL SIGNS: ED Triage Vitals  Enc Vitals Group     BP 01/06/18 1040 122/90     Pulse Rate 01/06/18 1040 74     Resp 01/06/18 1040 20     Temp 01/06/18 1040 98.7 F (37.1 C)     Temp Source 01/06/18 1040 Oral     SpO2 01/06/18 1040 99 %     Weight 01/06/18 1038 240 lb (108.9 kg)     Height 01/06/18 1038 5\' 11"  (1.803 m)     Head Circumference --      Peak Flow --      Pain Score --      Pain Loc --      Pain Edu? --      Excl. in GC? --  Constitutional: Alert and oriented. Well appearing and in no distress. Eyes: Normal exam ENT   Head: Normocephalic and atraumatic.   Mouth/Throat: Mucous membranes are moist. Cardiovascular: Normal rate, regular rhythm. No murmur Respiratory: Normal respiratory effort without tachypnea nor retractions. Breath sounds are clear  Gastrointestinal: Soft and nontender. No distention.  T Musculoskeletal: Nontender with normal range of motion in all extremities.  Neurologic:  Normal speech and language. No gross focal neurologic deficits  Skin:  Skin is warm, dry and intact.  Psychiatric: Flat affect.  Admits suicidal ideation this morning.  INITIAL IMPRESSION / ASSESSMENT AND PLAN / ED  COURSE  Pertinent labs & imaging results that were available during my care of the patient were reviewed by me and considered in my medical decision making (see chart for details).  Patient presents to the emergency department for worsening depression no suicidal ideation.  Patient states this morning he was having thoughts of killing himself once again.  Admits to alcohol use last night.  We will check labs, place the patient under IVC given suicidal ideation with a history of suicide attempts.  We will have psychiatry evaluate.  Labs are largely within normal limits besides an alcohol level of 85.  Medical workup is been largely normal.  Patient has been seen by psychiatry they recommend inpatient admission.  I have ordered medications as recommended.  I have also reordered the patient's home statin.  Patient states he has not been taking any of the other medications for some time.  We will hold off on reordering these medications until psychiatry can evaluate and make recommendations. ____________________________________________   FINAL CLINICAL IMPRESSION(S) / ED DIAGNOSES  Depression Alcohol use Suicidal ideation   [name], MD 01/06/18 1331    [name], MD 01/06/18 1334

## 2018-01-06 NOTE — BH Assessment (Signed)
Assessment Note  Ryan Gomez is an 52 y.o. male. IVC due to SI. Wife reports that pt engages in binge drinking often which has added to conflicts within the home. Wife reports that early morning she informed pt that she was leaving after he went to local tavern and drank heavily. When they got home pt responded by going to the bathroom alone and threatening to hurt himself with pocket knife. Pt has hx of suicidal attempt by hanging which occurred 6 years ago. Pt and wife reports to pt having hx of depression, alcohol use, and PTSD. Pt currently receives psychiatric tx at the [facility] (pt reports that he does not have a therapist at this time). Pt reports that he recently  "quit for 23 days and then picked back up on drinking." Pt denied active SI, HI, AH, or VH at time of assessment but does admit to having ideations "here and there"  Diagnosis: Depression  Past Medical History:  Past Medical History:  Diagnosis Date  . Bipolar affective disorder (HCC)   . Hyperlipidemia   . Stroke [facility]) September 2015   Cryptogenic. - He actually went to the emergency room about a week after onset of symptoms. - Reportedly had a normal echocardiogram and 48-hour monitor.    Past Surgical History:  Procedure Laterality Date  . CHOLECYSTECTOMY    . HERNIA REPAIR      Family History:  Family History  Problem Relation Age of Onset  . Stroke Paternal Grandmother   . Diabetes Daughter     Social History:  reports that  has never smoked. He uses smokeless tobacco. He reports that he drinks alcohol. He reports that he does not use drugs.  Additional Social History:  Alcohol / Drug Use Pain Medications: see mar Prescriptions: see mar Over the Counter: see mar History of alcohol / drug use?: Yes Longest period of sobriety (when/how long): 23 days Negative Consequences of Use: Personal relationships, Work / Programmer, multimedia, Surveyor, quantity Withdrawal Symptoms: Tremors Substance #1 Name of Substance  1: Alcohol 1 - Age of First Use: 15 1 - Amount (size/oz): varies 1 - Frequency: daily 1 - Duration: long periods of time 1 - Last Use / Amount: yesterday  CIWA: CIWA-Ar BP: 122/90 Pulse Rate: 74 COWS:    Allergies: No Known Allergies  Home Medications:  (Not in a hospital admission)  OB/GYN Status:  No LMP for male patient.  General Assessment Data Assessment unable to be completed: (Completed) Location of Assessment: [facility] ED TTS Assessment: In system Is this a Tele or Face-to-Face Assessment?: Face-to-Face Is this an Initial Assessment or a Re-assessment for this encounter?: Initial Assessment Marital status: Married [place] name: N/A Is patient pregnant?: No Pregnancy Status: No Living Arrangements: Spouse/significant other Can pt return to current living arrangement?: Yes Admission Status: Involuntary Is patient capable of signing voluntary admission?: Yes Referral Source: Self/Family/Friend Insurance type: Wellsite geologist Exam [facility] Walk-in ONLY) Medical Exam completed: Yes  Crisis Care Plan Living Arrangements: Spouse/significant other Legal Guardian: Other:(None) Name of Psychiat[facility] Name of Therapist: Pt reports he does not have current therapist  Education Status Is patient currently in school?: No Current Grade: N/A Highest grade of school patient has completed: Some college Name of school: N/A Contact person: N/A  Risk to self with the past 6 months Suicidal Ideation: No-Not Currently/Within Last 6 Months Has patient been a risk to self within the past 6 months prior to admission? : Yes Suicidal Intent: No-Not  Currently/Within Last 6 Months Has patient had any suicidal intent within the past 6 months prior to admission? : Yes Is patient at risk for suicide?: Yes Suicidal Plan?: No-Not Currently/Within Last 6 Months Has patient had any suicidal plan within the past 6 months prior to admission? : Yes Access  to Means: Yes Specify Access to Suicidal Means: Use pocket knife What has been your use of drugs/alcohol within the last 12 months?: Frequent Previous Attempts/Gestures: Yes How many times?: 2 Other Self Harm Risks: Substance use Triggers for Past Attempts: Spouse contact Intentional Self Injurious Behavior: None Family Suicide History: No Recent stressful life event(s): Conflict (Comment), Trauma (Comment) Persecutory voices/beliefs?: No Depression: Yes Depression Symptoms: Tearfulness, Isolating, Guilt, Feeling worthless/self pity Substance abuse history and/or treatment for substance abuse?: Yes Suicide prevention information given to non-admitted patients: Yes  Risk to Others within the past 6 months Homicidal Ideation: No Does patient have any lifetime risk of violence toward others beyond the six months prior to admission? : No Thoughts of Harm to Others: No Current Homicidal Intent: No Current Homicidal Plan: No Access to Homicidal Means: No Identified Victim: N/A History of harm to others?: No Assessment of Violence: None Noted Violent Behavior Description: N/A Does patient have access to weapons?: Yes (Comment)(Pocket knife) Criminal Charges Pending?: No Does patient have a court date: No Is patient on probation?: Unknown  Psychosis Hallucinations: None noted Delusions: None noted  Mental Status Report Appearance/Hygiene: In scrubs Eye Contact: Good Motor Activity: Tremors Speech: Logical/coherent Level of Consciousness: Alert Mood: Depressed, Fearful, Preoccupied, Sad Affect: Depressed Anxiety Level: Minimal Thought Processes: Relevant, Coherent Judgement: Unimpaired Orientation: Appropriate for developmental age, Situation, Time, Place, Person Obsessive Compulsive Thoughts/Behaviors: None  Cognitive Functioning Concentration: Normal Memory: Recent Intact IQ: Average Insight: Good Impulse Control: Good Appetite: Good Weight Loss: 0 Weight Gain:  0 Sleep: No Change Total Hours of Sleep: 8(depends on alcohol use, may vary) Vegetative Symptoms: None  ADLScreening [facility] Assessment Services) Patient's cognitive ability adequate to safely complete daily activities?: Yes Patient able to express need for assistance with ADLs?: Yes Independently performs ADLs?: Yes (appropriate for developmental age)  Prior Inpatient Therapy Prior Inpatient Therapy: Yes(Wife reports pt was admitted to [facility] about 6 years ago ) Prior Therapy Dates: 2013 Prior Therapy Facilty/Provider(s): [facility] Reason for Treatment: SI  Prior Outpatient Therapy Prior Outpatient Therapy: No Prior Therapy Dates: N/A Prior Therapy Facilty/Provider(s): N/A Reason for Treatment: N/A Does patient have an ACCT team?: No Does patient have Intensive In-House Services?  : No Does patient have [facility] services? : No Does patient have P4CC services?: No  ADL Screening (condition at time of admission) Patient's cognitive ability adequate to safely complete daily activities?: Yes Is the patient deaf or have difficulty hearing?: No Does the patient have difficulty seeing, even when wearing glasses/contacts?: No Does the patient have difficulty concentrating, remembering, or making decisions?: No Patient able to express need for assistance with ADLs?: Yes Does the patient have difficulty dressing or bathing?: No Independently performs ADLs?: Yes (appropriate for developmental age) Does the patient have difficulty walking or climbing stairs?: No Weakness of Legs: None Weakness of Arms/Hands: None     Therapy Consults (therapy consults require a physician order) PT Evaluation Needed: No OT Evalulation Needed: No SLP Evaluation Needed: No       Advance Directives (For Healthcare) Does Patient Have a Medical Advance Directive?: No Would patient like information on creating a medical advance directive?: No - Patient declined    Additional Information  1:1 In Past  12 Months?: No CIRT Risk: No Elopement Risk: No Does patient have medical clearance?: Yes     Disposition:  Disposition Initial Assessment Completed for this Encounter: Yes Disposition of Patient: Pending Review with psychiatrist  On Site Evaluation by:   Reviewed with Physician:    Aubery LappingJerrica  Metta Koranda 01/06/2018 4:54 PM

## 2018-01-06 NOTE — ED Notes (Signed)
IVC informed RN and ODS patient IVC status

## 2018-01-07 NOTE — ED Notes (Signed)
Patient resting quietly in room. No noted distress or abnormal behaviors noted. Will continue 15 minute checks and observation by security camera for safety. 

## 2018-01-07 NOTE — BH Assessment (Addendum)
Referral information for Psychiatric Hospitalization faxed to;   [facility]. [facility] [phone]),   . [place] [facility] [phone])  . [facility] [phone]),   . [facility] [phone]),   . [facility] [phone])- denied, states needs to detox 1st per Tim  . [facility] [phone]).               [place] [phone])               [place] [phone])             [facility]

## 2018-01-07 NOTE — ED Provider Notes (Signed)
-----------------------------------------   7:00 AM on 01/07/2018 -----------------------------------------   Blood pressure 136/83, pulse 63, temperature 98.1 F (36.7 C), temperature source Oral, resp. rate 16, height 5\' 11"  (1.803 m), weight 108.9 kg (240 lb), SpO2 100 %.  The patient had no acute events since last update.  Calm and cooperative at this time.  Disposition is pending Psychiatry/Behavioral Medicine team recommendations.     Rebecka ApleyWebster, Allison P, MD 01/07/18 0700

## 2018-01-07 NOTE — ED Notes (Signed)
Pt administered PRN Ativan for tremor and elevated BP.  Pt remains calm and cooperative with staff.

## 2018-01-07 NOTE — ED Notes (Signed)
Pt. Woke up and went to bathroom, pt.  Returned to room with steady gait.

## 2018-01-07 NOTE — BH Assessment (Signed)
Per [facility] "Rounding" Attending Physician (Dr. Isbell) the unit census will be holding at 22 till further notice. Thus, patient is being referred out. 

## 2018-01-07 NOTE — BH Assessment (Addendum)
Contacted [facility]to inquire if they had a psych bed available or if they were on diversion. Talked to Verdon CumminsJesse, who verified that they are on a psych diversion. Will begin the process of referring pt to other possible facilities.

## 2018-01-07 NOTE — ED Notes (Signed)
Pt's father called with pass code. When phone was available patient allowed to return phone call.  Calm and cooperative at this time. Maintained on 15 minute checks and observation by security camera for safety.

## 2018-01-08 NOTE — BH Assessment (Addendum)
Patient has been accepted to [facility].  Accepting physician is Dr. Jeannine KittenFarah.  Call report to [phone].  Representative was [place].  ER Staff is aware of it Rivka Barbara(Glenda, ER Sect.; Dr. Pershing ProudSchaevitz, ER MD & Ladona HornsEric S., Patient's Nurse)

## 2018-01-08 NOTE — ED Provider Notes (Signed)
-----------------------------------------   12:41 PM on 01/08/2018 -----------------------------------------   Blood pressure (!) 144/93, pulse 89, temperature 98.5 F (36.9 C), temperature source Oral, resp. rate 18, height 5\' 11"  (1.803 m), weight 108.9 kg (240 lb), SpO2 100 %.  The patient had no acute events since last update.  Calm and cooperative at this time.  Disposition is pending Psychiatry/Behavioral Medicine team recommendations.     Myrna BlazerSchaevitz, Amyriah Buras Matthew, MD 01/08/18 [phone]

## 2018-01-08 NOTE — ED Notes (Signed)
[facility] for continuation of specialized care. Report called to SunriverPatty, Charity fundraiserN. Pt understands transfer necessity and is agreeable (IVC'd regardless). Belongings were sent home with his wife upon [facility]. He offered no questions or concerns. He left in no acute distress.

## 2018-01-08 NOTE — ED Notes (Signed)
Pt laying in bed with eyes open on approach, watching TV. He is calm and cooperative. No acute distress noted. Appears depressed but does brighten in conversation. Speech is open and spontaneous. States he is doing "ok". Hopeful to be transferred at some point today. Support and encouragement provided. Denies SI/HI/AVH and contracts for safety. Denies pain/discomfort. POC reviewed and understanding verbalized. He offered no questions or concerns otherwise. Safety has been maintained with q15 min checks, hourly rounding and ODS obs. Will continue current POC pending disposition.

## 2018-01-08 NOTE — ED Provider Notes (Signed)
-----------------------------------------   7:09 AM on 01/08/2018 -----------------------------------------   Blood pressure (!) 144/93, pulse 89, temperature 98.5 F (36.9 C), temperature source Oral, resp. rate 18, height 5\' 11"  (1.803 m), weight 108.9 kg (240 lb), SpO2 100 %.  The patient had no acute events since last update.  Calm and cooperative at this time.  Disposition is pending Psychiatry/Behavioral Medicine team recommendations.     Myrna BlazerSchaevitz, David Matthew, MD 01/08/18 [phone]

## 2019-04-29 ENCOUNTER — Encounter: Payer: Self-pay | Admitting: Emergency Medicine

## 2019-04-29 ENCOUNTER — Emergency Department: Payer: 59

## 2019-04-29 ENCOUNTER — Emergency Department
Admission: EM | Admit: 2019-04-29 | Discharge: 2019-04-29 | Disposition: A | Discharge status: Home / Self Care | Payer: 59 | Attending: Emergency Medicine | Admitting: Emergency Medicine

## 2019-04-29 ENCOUNTER — Other Ambulatory Visit: Payer: Self-pay

## 2019-04-29 DIAGNOSIS — R079 Chest pain, unspecified: Secondary | ICD-10-CM | POA: Diagnosis present

## 2019-04-29 DIAGNOSIS — F17220 Nicotine dependence, chewing tobacco, uncomplicated: Secondary | ICD-10-CM | POA: Insufficient documentation

## 2019-04-29 DIAGNOSIS — Z79899 Other long term (current) drug therapy: Secondary | ICD-10-CM | POA: Diagnosis not present

## 2019-04-29 DIAGNOSIS — M79602 Pain in left arm: Secondary | ICD-10-CM | POA: Insufficient documentation

## 2019-04-29 DIAGNOSIS — M25512 Pain in left shoulder: Secondary | ICD-10-CM | POA: Insufficient documentation

## 2019-04-29 DIAGNOSIS — R002 Palpitations: Secondary | ICD-10-CM | POA: Diagnosis not present

## 2019-04-29 LAB — BASIC METABOLIC PANEL
Anion gap: 13 (ref 5–15)
BUN: 16 mg/dL (ref 6–20)
CO2: 22 mmol/L (ref 22–32)
Calcium: 9.6 mg/dL (ref 8.9–10.3)
Chloride: 101 mmol/L (ref 98–111)
Creatinine, Ser: 1.15 mg/dL (ref 0.61–1.24)
GFR calc Af Amer: >60 mL/min (ref >60)
GFR calc non Af Amer: >60 mL/min (ref >60)
Glucose, Bld: 142 mg/dL — ABNORMAL HIGH (ref 70–99)
Potassium: 3.9 mmol/L (ref 3.5–5.1)
Sodium: 136 mmol/L (ref 135–145)

## 2019-04-29 LAB — TROPONIN I
Troponin I: <0.03 ng/mL (ref <0.03)
Troponin I: <0.03 ng/mL (ref <0.03)

## 2019-04-29 LAB — CBC
HCT: 45.9 % (ref 39.0–52.0)
Hemoglobin: 16 g/dL (ref 13.0–17.0)
MCH: 31.6 pg (ref 26.0–34.0)
MCHC: 34.9 g/dL (ref 30.0–36.0)
MCV: 90.5 fL (ref 80.0–100.0)
Platelets: 246 10*3/uL (ref 150–400)
RBC: 5.07 MIL/uL (ref 4.22–5.81)
RDW: 11.7 % (ref 11.5–15.5)
WBC: 8.1 10*3/uL (ref 4.0–10.5)
nRBC: 0 % (ref 0.0–0.2)

## 2019-04-29 NOTE — ED Triage Notes (Signed)
Pt in via POV, reports some left arm pain, followed by palpitations, states, "I felt like my heart was racing."  Pt reports episode lasted for a couple minutes.  Denies any pain at this time.  Ambulatory to triage, NAD noted at this time.

## 2019-04-29 NOTE — ED Provider Notes (Signed)
[facility] Emergency Department Provider Note  Time seen: 6:34 PM  I have reviewed the triage vital signs and the nursing notes.   HISTORY  Chief Complaint Palpitations and Chest Pain   HPI Ryan Gomez is a 53 y.o. male with a past medical history of bipolar, hyperlipidemia, CVA, presents to the emergency department for palpitations and left arm discomfort.  According to the patient around 2:30 PM today at work he developed some discomfort in his left shoulder and left arm.  States he tried to move the arm around to see if it would help and it was not helping.  Patient became concerned that it could be his heart, states he felt his pulse and it felt like it was racing, checked his blood pressure and it was 150 systolic which concerned him so he came to the emergency department.  Patient denies any history of cardiac disease in the past, states he has had a normal stress test previously but that was several years ago.  Denies any discomfort in the arm or chest currently.  Denies any fever cough congestion.  Denies any leg pain or swelling.    Past Medical History:  Diagnosis Date  . Bipolar affective disorder (HCC)   . Hyperlipidemia   . 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 - He actually went to the emergency room about a week after onset of symptoms. - Reportedly had a normal echocardiogram and 48-hour monitor.    Patient Active Problem List   Diagnosis Date Noted  . History of stroke 05/27/2016    Past Surgical History:  Procedure Laterality Date  . CHOLECYSTECTOMY    . HERNIA REPAIR      Prior to Admission medications   Medication Sig Start Date End Date Taking? Authorizing Provider  buPROPion (WELLBUTRIN) 100 MG tablet Take 300 mg by mouth once.    [provider]  FLUoxetine (PROZAC) 40 MG capsule Take 40 mg by mouth daily.    [provider]  lithium 300 MG tablet Take 300 mg by mouth at bedtime.     [provider]  omeprazole (PRILOSEC) 10 MG capsule Take 10 mg by mouth daily.    [provider]    No Known Allergies  Family History  Problem Relation Age of Onset  . Stroke Paternal Grandmother   . Diabetes Daughter     Social History Social History   Tobacco Use  . Smoking status: Never Smoker  . Smokeless tobacco: Current User  Substance Use Topics  . Alcohol use: Yes  . Drug use: No    Review of Systems Constitutional: Negative for fever. ENT: Negative for recent illness/congestion Cardiovascular: Left shoulder pain.  Denies any central chest pain. Respiratory: Negative for shortness of breath. Gastrointestinal: Negative for abdominal pain, vomiting  Musculoskeletal: Left arm/shoulder pain around 2:30 PM, has since resolved. Skin: Negative for skin complaints  Neurological: Negative for headache All other ROS negative  ____________________________________________   PHYSICAL EXAM:  VITAL SIGNS: ED Triage Vitals  Enc Vitals Group     BP 04/29/19 1556 (!) 147/108     Pulse Rate 04/29/19 1556 77     Resp 04/29/19 1556 16     Temp 04/29/19 1556 98.7 F (37.1 C)     Temp Source 04/29/19 1556 Oral     SpO2 04/29/19 1556 99 %     Weight 04/29/19 1557 227 lb (103 kg)     Height 04/29/19 1557 5\' 11"  (1.803 m)  Head Circumference --      Peak Flow --      Pain Score 04/29/19 1556 0     Pain Loc --      Pain Edu? --      Excl. in GC? --     Constitutional: Alert and oriented. Well appearing and in no distress. Eyes: Normal exam ENT      Head: Normocephalic and atraumatic.      Mouth/Throat: Mucous membranes are moist. Cardiovascular: Normal rate, regular rhythm.  Respiratory: Normal respiratory effort without tachypnea nor retractions. Breath sounds are clear  Gastrointestinal: Soft and nontender. No distention.   Musculoskeletal: Nontender with normal range of motion in all extremities.  Neurologic:  Normal speech and language. No gross  focal neurologic deficits Skin:  Skin is warm, dry and intact.  Psychiatric: Mood and affect are normal.   ____________________________________________    EKG  EKG viewed and interpreted by myself shows a normal sinus rhythm at 72 bpm with a narrow QRS, left axis deviation, largely normal intervals, no ST changes.  ____________________________________________    RADIOLOGY  Chest x-ray is negative ____________________________________________   INITIAL IMPRESSION / ASSESSMENT AND PLAN / ED COURSE  Pertinent labs & imaging results that were available during my care of the patient were reviewed by me and considered in my medical decision making (see chart for details).   Patient presents to the emergency department for left arm/shoulder discomfort starting around 2:30 PM.  Differential would include ACS, angina, arrhythmia, hypertension, anxiety, musculoskeletal pain.  Overall the patient appears well denies any symptoms at this time including any chest pain arm pain or trouble breathing.  We will check labs including cardiac enzymes, EKG and chest x-ray.  Patient's work-up including EKG, chest x-ray and cardiac enzymes are largely within normal limits.  We will repeat a troponin.  If the repeat troponin remains negative patient will be discharged with cardiology follow-up.  Patient agreeable to plan of care.  Continues to be asymptomatic in the emergency department.  Patient's repeat troponin is negative.  Continues to be asymptomatic in the emergency department.  We will discharge with cardiology follow-up.  Patient agreeable to plan of care.  [name] was evaluated in Emergency Department on 04/29/2019 for the symptoms described in the history of present illness. He was evaluated in the context of the global COVID-19 pandemic, which necessitated consideration that the patient might be at risk for infection with the SARS-CoV-2 virus that causes COVID-19. Institutional protocols  and algorithms that pertain to the evaluation of patients at risk for COVID-19 are in a state of rapid change based on information released by regulatory bodies including the CDC and federal and state organizations. These policies and algorithms were followed during the patient's care in the ED.  ____________________________________________   FINAL CLINICAL IMPRESSION(S) / ED DIAGNOSES  Chest pain Palpitations   Minna AntisPaduchowski, Aeon Koors, MD 04/29/19 1933

## 2020-09-11 ENCOUNTER — Encounter: Payer: Self-pay | Admitting: Podiatry

## 2020-09-11 ENCOUNTER — Other Ambulatory Visit: Payer: Self-pay

## 2020-09-11 ENCOUNTER — Ambulatory Visit: Payer: 59 | Admitting: Podiatry

## 2020-09-11 ENCOUNTER — Encounter: Payer: Self-pay | Admitting: *Deleted

## 2020-09-11 DIAGNOSIS — S90212A Contusion of left great toe with damage to nail, initial encounter: Secondary | ICD-10-CM | POA: Diagnosis not present

## 2020-09-11 DIAGNOSIS — S90222A Contusion of left lesser toe(s) with damage to nail, initial encounter: Secondary | ICD-10-CM

## 2020-09-11 DIAGNOSIS — S90211A Contusion of right great toe with damage to nail, initial encounter: Secondary | ICD-10-CM | POA: Diagnosis not present

## 2020-09-11 DIAGNOSIS — S90221A Contusion of right lesser toe(s) with damage to nail, initial encounter: Secondary | ICD-10-CM

## 2020-09-11 NOTE — Patient Instructions (Signed)

## 2020-09-11 NOTE — Progress Notes (Signed)
° °  HPI: 54 y.o. male presenting today as a new patient for evaluation of an injury to the bilateral great toenails.  Patient went to a rock concert for 3-4 days in [place].  He states that the ground was very uneven and he experienced pain to his bilateral great toenails.  Over the course of the 3 days the toes became very painful and tender.  He states that the toenails have turned purple now.  He has not done anything for treatment.  He presents for further treatment and evaluation  Past Medical History:  Diagnosis Date   Bipolar affective disorder (HCC)    Hyperlipidemia    Stroke [facility]) September 2015   Cryptogenic. - He actually went to the emergency room about a week after onset of symptoms. - Reportedly had a normal echocardiogram and 48-hour monitor.     Physical Exam: General: The patient is alert and oriented x3 in no acute distress.  Dermatology: Skin is warm, dry and supple bilateral lower extremities. Negative for open lesions or macerations.  Subungual hematoma noted with nail plate lifting of the bilateral great toenails.  Very sensitive to touch.  No open lesions noted.  It appears that the nail plates are loosely adhered.  Vascular: Palpable pedal pulses bilaterally. No edema or erythema noted. Capillary refill within normal limits.  Neurological: Epicritic and protective threshold grossly intact bilaterally.   Musculoskeletal Exam: Range of motion within normal limits to all pedal and ankle joints bilateral. Muscle strength 5/5 in all groups bilateral.   Radiographic Exam:  Normal osseous mineralization. Joint spaces preserved. No fracture/dislocation/boney destruction.    Assessment: 1.  Subungual hematoma secondary to trauma bilateral toenail plates   Plan of Care:  1. Patient evaluated. X-Rays reviewed.  2.  After evaluation the decision was made to perform total temporary nail avulsions to the bilateral great toe nail plates.  The patient agrees.  All patient  questions answered. 3.  Prior to procedure 3 mL of 2% lidocaine plain was infiltrated in a digital block fashion to the bilateral great toes.  The toes were prepped in aseptic manner and total nail avulsions were performed followed by a light dressing applied. 4.  Note for work was provided today.  Patient may return to work on Monday, 09/14/2020. 5.  Post care instructions were provided for the patient. 6.  Return to clinic as needed      Felecia Shelling, DPM [facility]  Dr. Felecia Shelling, DPM    [address] [place], [place] [address]                Office [phone]  Fax [phone]

## 2021-01-24 ENCOUNTER — Emergency Department (HOSPITAL_COMMUNITY): Payer: Non-veteran care

## 2021-01-24 ENCOUNTER — Encounter (HOSPITAL_COMMUNITY): Payer: Self-pay | Admitting: Emergency Medicine

## 2021-01-24 ENCOUNTER — Other Ambulatory Visit: Payer: Self-pay

## 2021-01-24 ENCOUNTER — Emergency Department (HOSPITAL_COMMUNITY)
Admission: EM | Admit: 2021-01-24 | Discharge: 2021-01-27 | Payer: Non-veteran care | Attending: Emergency Medicine | Admitting: Emergency Medicine

## 2021-01-24 DIAGNOSIS — F10129 Alcohol abuse with intoxication, unspecified: Secondary | ICD-10-CM | POA: Insufficient documentation

## 2021-01-24 DIAGNOSIS — R259 Unspecified abnormal involuntary movements: Secondary | ICD-10-CM | POA: Diagnosis not present

## 2021-01-24 DIAGNOSIS — F332 Major depressive disorder, recurrent severe without psychotic features: Secondary | ICD-10-CM | POA: Insufficient documentation

## 2021-01-24 DIAGNOSIS — Z046 Encounter for general psychiatric examination, requested by authority: Secondary | ICD-10-CM | POA: Diagnosis present

## 2021-01-24 DIAGNOSIS — R45851 Suicidal ideations: Secondary | ICD-10-CM | POA: Insufficient documentation

## 2021-01-24 DIAGNOSIS — R000 Tachycardia, unspecified: Secondary | ICD-10-CM | POA: Diagnosis not present

## 2021-01-24 DIAGNOSIS — Z79899 Other long term (current) drug therapy: Secondary | ICD-10-CM | POA: Diagnosis not present

## 2021-01-24 DIAGNOSIS — D72829 Elevated white blood cell count, unspecified: Secondary | ICD-10-CM | POA: Insufficient documentation

## 2021-01-24 DIAGNOSIS — F4310 Post-traumatic stress disorder, unspecified: Secondary | ICD-10-CM | POA: Insufficient documentation

## 2021-01-24 DIAGNOSIS — Z20822 Contact with and (suspected) exposure to covid-19: Secondary | ICD-10-CM | POA: Diagnosis not present

## 2021-01-24 DIAGNOSIS — Z8673 Personal history of transient ischemic attack (TIA), and cerebral infarction without residual deficits: Secondary | ICD-10-CM | POA: Insufficient documentation

## 2021-01-24 DIAGNOSIS — R519 Headache, unspecified: Secondary | ICD-10-CM | POA: Diagnosis not present

## 2021-01-24 DIAGNOSIS — I10 Essential (primary) hypertension: Secondary | ICD-10-CM | POA: Diagnosis not present

## 2021-01-24 DIAGNOSIS — F1290 Cannabis use, unspecified, uncomplicated: Secondary | ICD-10-CM | POA: Diagnosis not present

## 2021-01-24 HISTORY — DX: Post-traumatic stress disorder, unspecified: F43.10

## 2021-01-24 HISTORY — DX: Essential (primary) hypertension: I10

## 2021-01-24 HISTORY — DX: Depression, unspecified: F32.A

## 2021-01-24 LAB — COMPREHENSIVE METABOLIC PANEL
ALT: 26 U/L (ref 0–44)
AST: 30 U/L (ref 15–41)
Albumin: 5 g/dL (ref 3.5–5.0)
Alkaline Phosphatase: 92 U/L (ref 38–126)
Anion gap: 11 (ref 5–15)
BUN: 10 mg/dL (ref 6–20)
CO2: 21 mmol/L — ABNORMAL LOW (ref 22–32)
Calcium: 9.9 mg/dL (ref 8.9–10.3)
Chloride: 107 mmol/L (ref 98–111)
Creatinine, Ser: 1.27 mg/dL — ABNORMAL HIGH (ref 0.61–1.24)
GFR, Estimated: >60 mL/min (ref >60)
Glucose, Bld: 126 mg/dL — ABNORMAL HIGH (ref 70–99)
Potassium: 4.3 mmol/L (ref 3.5–5.1)
Sodium: 139 mmol/L (ref 135–145)
Total Bilirubin: 3.6 mg/dL — ABNORMAL HIGH (ref 0.3–1.2)
Total Protein: 8 g/dL (ref 6.5–8.1)

## 2021-01-24 LAB — RAPID URINE DRUG SCREEN, HOSP PERFORMED
Amphetamines: NOT DETECTED
Barbiturates: NOT DETECTED
Benzodiazepines: NOT DETECTED
Cocaine: NOT DETECTED
Opiates: NOT DETECTED
Tetrahydrocannabinol: POSITIVE — AB

## 2021-01-24 LAB — SALICYLATE LEVEL: Salicylate Lvl: <7 mg/dL — ABNORMAL LOW (ref 7.0–30.0)

## 2021-01-24 LAB — CBC WITH DIFFERENTIAL/PLATELET
Abs Immature Granulocytes: 0.07 10*3/uL (ref 0.00–0.07)
Basophils Absolute: 0.1 10*3/uL (ref 0.0–0.1)
Basophils Relative: 1 %
Eosinophils Absolute: 0.1 10*3/uL (ref 0.0–0.5)
Eosinophils Relative: 0 %
HCT: 50.2 % (ref 39.0–52.0)
Hemoglobin: 17 g/dL (ref 13.0–17.0)
Immature Granulocytes: 1 %
Lymphocytes Relative: 16 %
Lymphs Abs: 2.5 10*3/uL (ref 0.7–4.0)
MCH: 31.5 pg (ref 26.0–34.0)
MCHC: 33.9 g/dL (ref 30.0–36.0)
MCV: 93 fL (ref 80.0–100.0)
Monocytes Absolute: 1 10*3/uL (ref 0.1–1.0)
Monocytes Relative: 7 %
Neutro Abs: 11.7 10*3/uL — ABNORMAL HIGH (ref 1.7–7.7)
Neutrophils Relative %: 75 %
Platelets: 313 10*3/uL (ref 150–400)
RBC: 5.4 MIL/uL (ref 4.22–5.81)
RDW: 12 % (ref 11.5–15.5)
WBC: 15.5 10*3/uL — ABNORMAL HIGH (ref 4.0–10.5)
nRBC: 0 % (ref 0.0–0.2)

## 2021-01-24 LAB — LITHIUM LEVEL: Lithium Lvl: 0.37 mmol/L — ABNORMAL LOW (ref 0.60–1.20)

## 2021-01-24 LAB — RESP PANEL BY RT-PCR (FLU A&B, COVID) ARPGX2
Influenza A by PCR: NEGATIVE
Influenza B by PCR: NEGATIVE
SARS Coronavirus 2 by RT PCR: NEGATIVE

## 2021-01-24 LAB — ETHANOL: Alcohol, Ethyl (B): <10 mg/dL (ref <10)

## 2021-01-24 LAB — ACETAMINOPHEN LEVEL: Acetaminophen (Tylenol), Serum: <10 ug/mL — ABNORMAL LOW (ref 10–30)

## 2021-01-24 MED ORDER — PROCHLORPERAZINE MALEATE 5 MG PO TABS
10.0000 mg | ORAL_TABLET | Freq: Once | ORAL | Status: AC
  Administered 2021-01-24: 10 mg via ORAL
  Filled 2021-01-24: qty 2

## 2021-01-24 MED ORDER — ACETAMINOPHEN 325 MG PO TABS: 650.0000 mg | ORAL_TABLET | ORAL | Status: DC | PRN

## 2021-01-24 MED ORDER — BUPROPION HCL 75 MG PO TABS
150.0000 mg | ORAL_TABLET | Freq: Once | ORAL | Status: AC
  Administered 2021-01-24: 150 mg via ORAL
  Filled 2021-01-24 (×2): qty 2

## 2021-01-24 MED ORDER — FLUOXETINE HCL 20 MG PO CAPS
40.0000 mg | ORAL_CAPSULE | Freq: Every day | ORAL | Status: DC
  Administered 2021-01-24 – 2021-01-27 (×4): 40 mg via ORAL
  Filled 2021-01-24 (×4): qty 2

## 2021-01-24 MED ORDER — LITHIUM CARBONATE 150 MG PO CAPS
300.0000 mg | ORAL_CAPSULE | Freq: Every day | ORAL | Status: DC
  Administered 2021-01-24 – 2021-01-26 (×3): 300 mg via ORAL
  Filled 2021-01-24 (×3): qty 2

## 2021-01-24 MED ORDER — BUSPIRONE HCL 5 MG PO TABS
7.5000 mg | ORAL_TABLET | Freq: Two times a day (BID) | ORAL | Status: DC
  Administered 2021-01-24 – 2021-01-27 (×6): 7.5 mg via ORAL
  Filled 2021-01-24 (×7): qty 2

## 2021-01-24 NOTE — ED Notes (Signed)
Patient transported to CT 
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
## 2021-01-24 NOTE — ED Notes (Signed)
Urine sample was given.

## 2021-01-24 NOTE — ED Notes (Signed)
Patient was given a drink and a bag lunch.
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
## 2021-01-24 NOTE — ED Notes (Signed)
Patient dressed out in purple scrubs. Belongings were placed on bottom shelf in locker room. Security wanding at bedside.
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
## 2021-01-25 MED ORDER — LORAZEPAM 1 MG PO TABS
0.0000 mg | ORAL_TABLET | Freq: Four times a day (QID) | ORAL | Status: AC
  Administered 2021-01-25 – 2021-01-26 (×3): 2 mg via ORAL
  Administered 2021-01-26: 1 mg via ORAL
  Administered 2021-01-26: 2 mg via ORAL
  Administered 2021-01-27: 1 mg via ORAL
  Filled 2021-01-25 (×3): qty 2
  Filled 2021-01-25 (×2): qty 1
  Filled 2021-01-25: qty 2

## 2021-01-25 MED ORDER — LORAZEPAM 2 MG/ML IJ SOLN: 0.0000 mg | Freq: Four times a day (QID) | INTRAMUSCULAR | Status: AC

## 2021-01-25 MED ORDER — LORAZEPAM 1 MG PO TABS: 0.0000 mg | ORAL_TABLET | Freq: Two times a day (BID) | ORAL | Status: DC

## 2021-01-25 MED ORDER — LORAZEPAM 2 MG/ML IJ SOLN: 0.0000 mg | Freq: Two times a day (BID) | INTRAMUSCULAR | Status: DC

## 2021-01-25 MED ORDER — THIAMINE HCL 100 MG/ML IJ SOLN: 100.0000 mg | Freq: Every day | INTRAMUSCULAR | Status: DC

## 2021-01-25 MED ORDER — THIAMINE HCL 100 MG PO TABS
100.0000 mg | ORAL_TABLET | Freq: Every day | ORAL | Status: DC
  Administered 2021-01-25 – 2021-01-27 (×3): 100 mg via ORAL
  Filled 2021-01-25 (×3): qty 1

## 2021-01-25 NOTE — Progress Notes (Signed)
CSW made multiple attempts to reach out to [facility] for possible admission.  CSW was unable to speak with anyone.   CSW will continue to assist in seeking appropriate bed placement.    Jacinta Shoe, LCSW Clinical Social Worker - Disposition [phone]

## 2021-01-25 NOTE — ED Notes (Signed)
Pt resting at this time vitals to be obtained when awake

## 2021-01-26 LAB — CBC WITH DIFFERENTIAL/PLATELET
Abs Immature Granulocytes: 0.02 10*3/uL (ref 0.00–0.07)
Basophils Absolute: 0 10*3/uL (ref 0.0–0.1)
Basophils Relative: 1 %
Eosinophils Absolute: 0.1 10*3/uL (ref 0.0–0.5)
Eosinophils Relative: 1 %
HCT: 47.5 % (ref 39.0–52.0)
Hemoglobin: 16.1 g/dL (ref 13.0–17.0)
Immature Granulocytes: 0 %
Lymphocytes Relative: 27 %
Lymphs Abs: 2.3 10*3/uL (ref 0.7–4.0)
MCH: 31.9 pg (ref 26.0–34.0)
MCHC: 33.9 g/dL (ref 30.0–36.0)
MCV: 94.1 fL (ref 80.0–100.0)
Monocytes Absolute: 0.6 10*3/uL (ref 0.1–1.0)
Monocytes Relative: 7 %
Neutro Abs: 5.4 10*3/uL (ref 1.7–7.7)
Neutrophils Relative %: 64 %
Platelets: 290 10*3/uL (ref 150–400)
RBC: 5.05 MIL/uL (ref 4.22–5.81)
RDW: 11.9 % (ref 11.5–15.5)
WBC: 8.5 10*3/uL (ref 4.0–10.5)
nRBC: 0 % (ref 0.0–0.2)

## 2021-01-26 LAB — BASIC METABOLIC PANEL
Anion gap: 7 (ref 5–15)
BUN: 12 mg/dL (ref 6–20)
CO2: 22 mmol/L (ref 22–32)
Calcium: 9.3 mg/dL (ref 8.9–10.3)
Chloride: 106 mmol/L (ref 98–111)
Creatinine, Ser: 1.3 mg/dL — ABNORMAL HIGH (ref 0.61–1.24)
GFR, Estimated: >60 mL/min (ref >60)
Glucose, Bld: 202 mg/dL — ABNORMAL HIGH (ref 70–99)
Potassium: 4.3 mmol/L (ref 3.5–5.1)
Sodium: 135 mmol/L (ref 135–145)

## 2021-01-26 LAB — SARS CORONAVIRUS 2 BY RT PCR (HOSPITAL ORDER, PERFORMED IN ~~LOC~~ HOSPITAL LAB): SARS Coronavirus 2: NEGATIVE

## 2021-01-26 NOTE — Progress Notes (Signed)
Pt continues to meet criteria for inpatient treatment.   CSW was able to fax referral to the [facility] for review.  Disposition CSW will continue to seek appropriate bed placement.     Jacinta Shoe, LCSW Clinical Social Worker - Disposition  [phone]

## 2021-01-26 NOTE — ED Notes (Addendum)
pts CIWA score is a 13. Encouraged pt to let nurse give him a IV for IV ativan. Pt is refusing IV at this time. Pt denies feeling nausea, denies emesis. Pt states he feels hot then cold. Pt is afebrile. Obvious tremors noted. PO ativan given per MD orders. Will continue to monitor pt. Pt is A/O x4. Pt is resting in bed at this time.
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
## 2021-01-26 NOTE — ED Notes (Signed)
pts family updated. Pts father states that the number for the [place] social worker is [phone] with ext. [phone].

## 2021-01-26 NOTE — ED Notes (Signed)
TTS speaking with patient at this time.

## 2021-01-26 NOTE — ED Provider Notes (Addendum)
Emergency Medicine Observation Re-evaluation Note  Ryan Gomez is a 55 y.o. male, seen on rounds today.  Pt initially presented to the ED for complaints of Depression (Pt under IVC for suicidal gesture; petitioner is Pt's father.  ) Currently, the patient is Resting comfortably, awaiting disposition.  Physical Exam  BP (!) 132/99   Pulse 76   Temp 98.6 F (37 C) (Oral)   Resp 18   Ht 5\' 11"  (1.803 m)   Wt 91 kg   SpO2 99%   BMI 27.98 kg/m  Physical Exam General: Awake and alert Lungs: No respiratory distress Psych: calm and cooperative  ED Course / MDM  EKG:EKG Interpretation  Date/Time:  Sunday January 24 2021 13:45:31 EST Ventricular Rate:  83 PR Interval:  172 QRS Duration: 94 QT Interval:  406 QTC Calculation: 477 R Axis:   -25 Text Interpretation: Normal sinus rhythm Cannot rule out Anterior infarct , age undetermined Abnormal ECG No significant change since prior 5/20 Confirmed by 6/20 [phone]) on 01/24/2021 2:05:31 PM    I have reviewed the labs performed to date as well as medications administered while in observation.  Recent changes in the last 24 hours include none.  Plan  Current plan is for Psych eval and dispo. Patient is under full IVC at this time.    2:07 PM I spoke with Dr. 01/26/2021, psychiatry at [facility] who is reviewing the patient's case for potential admission to their psych unit. He is requesting a recheck of BMP and CBC given abnormal values on initial presentation as well as an updated Covid test.    [facility], MD 01/26/21 [phone]

## 2021-01-26 NOTE — Progress Notes (Signed)
CSW faxed new lab results to [facility] Admission, Dr. Marcille Blanco, fax# [phone].  Dr. Horris Latino requested a copy of the patients CBC, BMP and Covid swap to determine possible admittance to the facility.  Ladoris Gene MSW,LCSWA,LCASA Clinical Social Worker   Disposition [phone] (cell)

## 2021-01-26 NOTE — ED Provider Notes (Addendum)
Patient's repeat CBC and basic metabolic panel showed resolution of his leukocytosis.  Electrolytes remain very stable.  His Covid PCR from today is negative.  I spoke with Dr. Loyal Buba psychiatrist at [facility] approximately an hour ago.  They are willing to accept patient.  He wanted me to verify the patient was off any IV drips.  And he is.  Patient should be ready for transfer to the [facility].  They will accept him up until 10 PM tonight.  If not he will have to go in the morning.  Have texted the social worker to see if we can make this happen.  [facility] [phone]   Vanetta Mulders, MD 01/26/21 Michaelene Song    Vanetta Mulders, MD 01/26/21 [phone]

## 2021-01-26 NOTE — ED Notes (Signed)
Pt appears aggitated and worried. He states "how much longer do I have to be here and I'm freaking out because my wife has not called me". Pt scored 12 on CIWA. Ativan given per mar. Pt otherwise a/o x4. No other complaints. Will continue to monitor patient.

## 2021-01-26 NOTE — ED Notes (Signed)
Pt sitting up in bed crying at this time. Will continue to monitor him.

## 2021-01-27 NOTE — ED Notes (Signed)
Information faxed to facility.

## 2021-01-27 NOTE — ED Notes (Signed)
Called [facility] for transport to [facility] earlier.

## 2021-01-27 NOTE — Progress Notes (Signed)
Pt accepted to [facility] for ED to ED transfer.   Dr. Margarita Sermons is the accepting provider.  Call report to [phone]. [phone].    Fleet Contras RN @ [facility] ED notified via secure chat.  Pt is IVC.  Pt may be transported by [facility]   Pt scheduled  to arrive at [facility] ED by 10PM.    Jacinta Shoe, LCSW Clinical Social Worker - Disposition  [phone]

## 2021-01-27 NOTE — Progress Notes (Addendum)
Disposition CSW received call from [facility] requesting updated CBC and Covid labs. [facility] representative confirmed CBC lab completed 01/26/2021 @1434  and COVID PCR completed 01/26/2021 was sufficient. CSW to fax updated labs as requested.   Signed:  03/26/2021, MSW, [place], LCASA 01/27/2021 9:47 AM   Disposition CSW attempted to fax requested documents to number provided. CSW attempted to callback [facility] to get an alternative fax number. [facility] representative was not reached; CSW left message with callback request. Situation ongoing, CSW to make additional attempts to reach 03/27/2021.   Signed:  [place], MSW, LCSWA, LCASA 01/27/2021 10:20 AM

## 2021-01-27 NOTE — ED Notes (Signed)
Attempted to call report. No answer.

## 2021-01-27 NOTE — BH Assessment (Addendum)
Reassessment note: Pt presents sitting on side of bed. He states he is "good, better" than on arrival. Pt denies current SI. He reports 2 past suicide attempts, with most recent 4-5 years ago. Pt is unable to remember a specific trigger, but states he was drinking at the time. Pt states he does have a problem with alcohol. He said he tried AA a couple times it didn't do anything for him. He reports alcohol tx through the [place] that worked for a little while but then he got around friends who drink, too. Pt states his next [place] psychiatry appt is scheduled for sometime in March. Pt denies he is currently in therapy, doesn't think it has ever helped him very much but he is agreeable to trying. Pt denies AVH and HI. NP continues to recommend inpt tx.

## 2021-01-27 NOTE — Progress Notes (Signed)
CSW reached out to [facility] to inquire about whether bed is still available. CSW explained to the [employer] that there was a transportation barrier.   Due to Pt not arriving at scheduled time, he will have to be resubmitted for admission.   CSW provided return phone number and [facility] [place] will notify if Pt's is able to come.     Jacinta Shoe, LCSW Clinical Social Worker - Disposition  [phone]

## 2022-05-21 IMAGING — CT CT HEAD W/O CM
3 series · 15 of 47 positions shown, 18 images · non-contrast
Comparison: None.

CLINICAL DATA: Neck trauma, midline tenderness.

EXAM:
CT HEAD WITHOUT CONTRAST
CT CERVICAL SPINE WITHOUT CONTRAST
TECHNIQUE: Multidetector CT imaging of the head and cervical spine was
performed following the standard protocol without intravenous
contrast. Multiplanar CT image reconstructions of the cervical spine
were also generated.

[Series 2: head w o · axial · 0.42mm/px · z∈[-5,+145]mm · 9 of 36 slices shown, 12 images]
[im 3/36  brain]
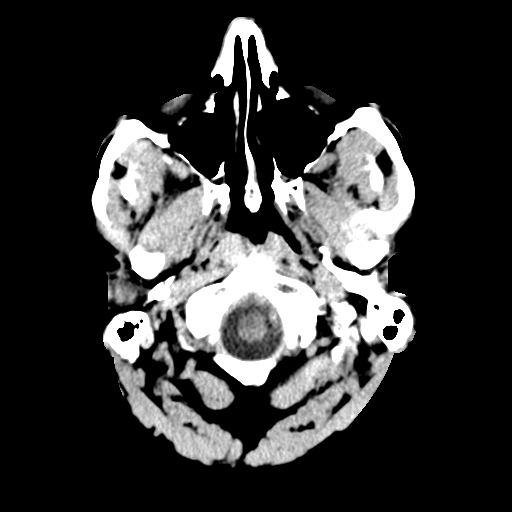
[im 3/36  bone]
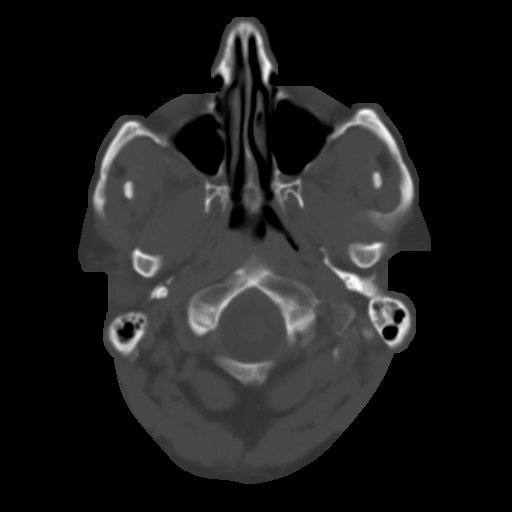
[im 7/36  brain]
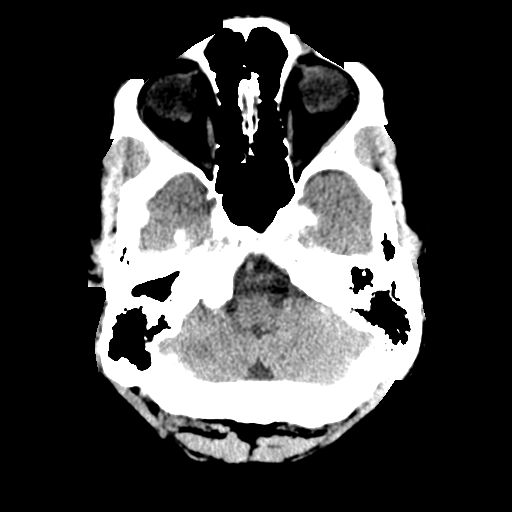
[im 10/36  brain]
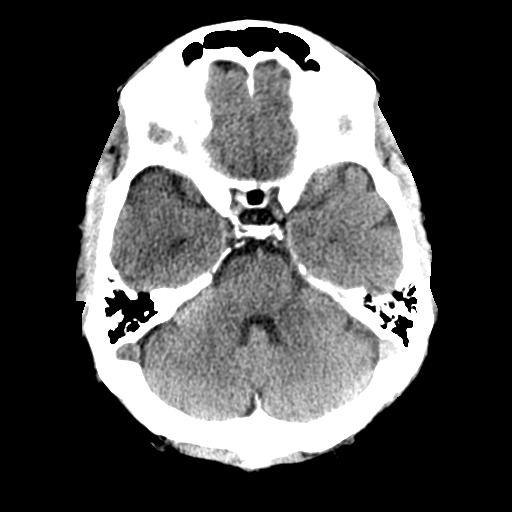
[im 14/36  brain]
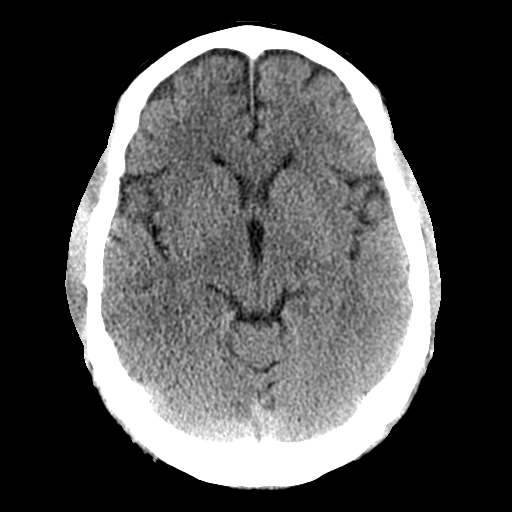
[im 19/36  brain]
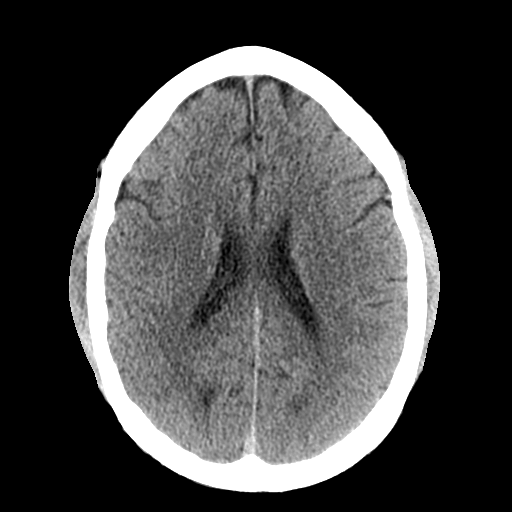
[im 19/36  bone]
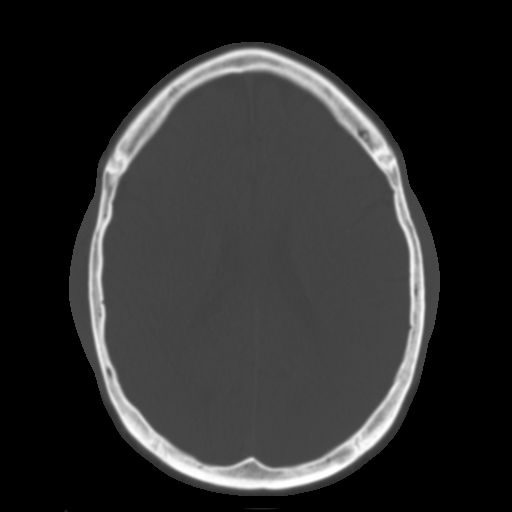
[im 22/36  brain]
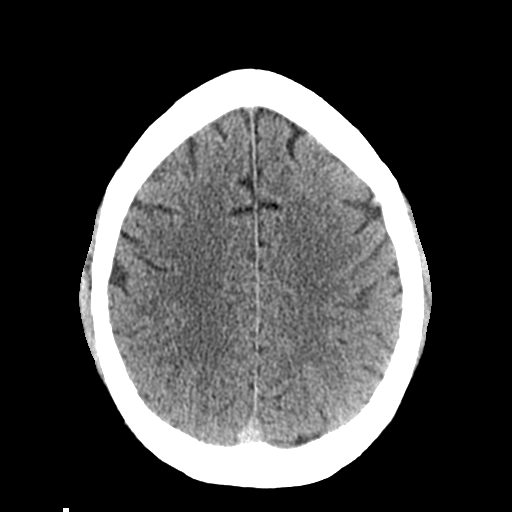
[im 26/36  brain]
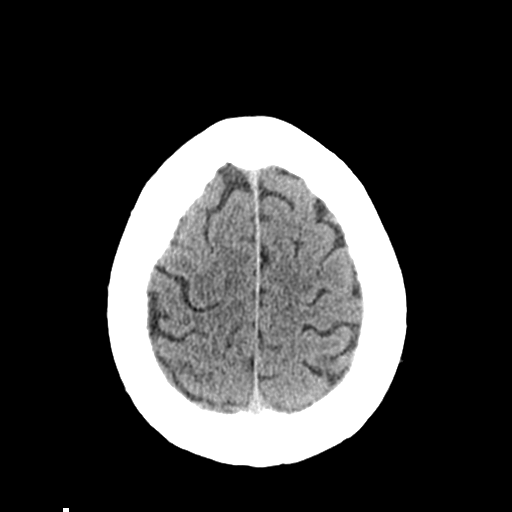
[im 29/36  brain]
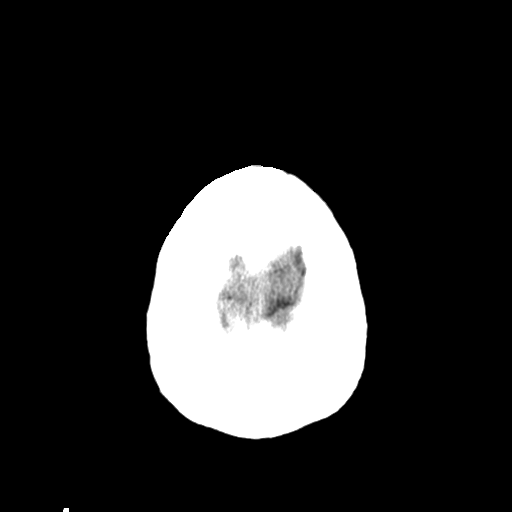
[im 33/36  brain]
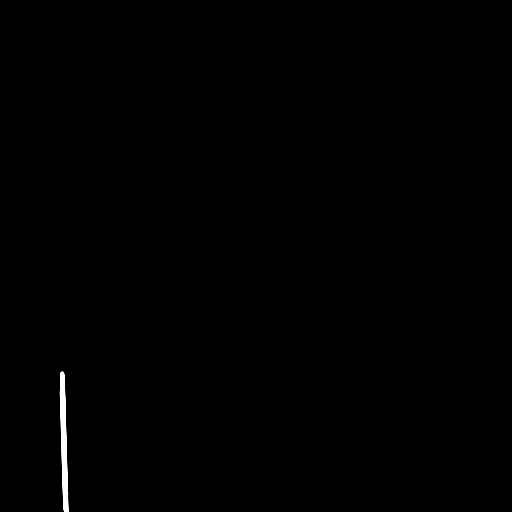
[im 33/36  bone]
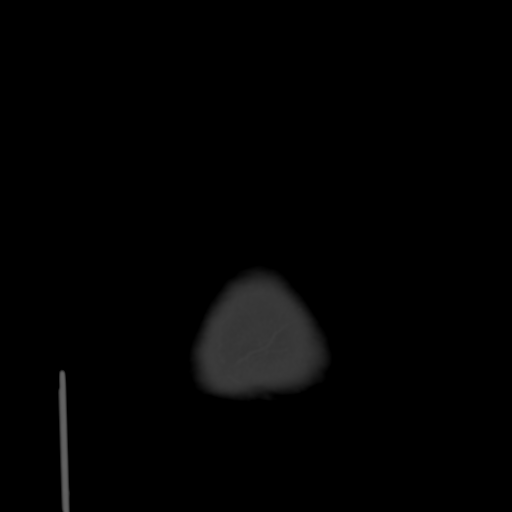

[Series 4: coronal soft · coronal · 0.35mm/px · 3 of 72 slices shown]
[im 24/72  brain]
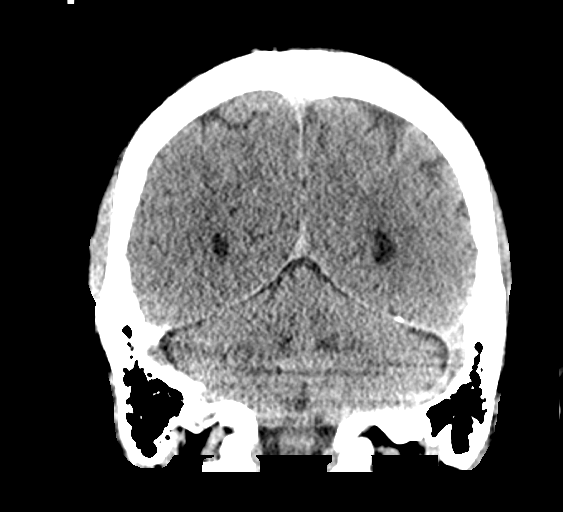
[im 32/72  brain]
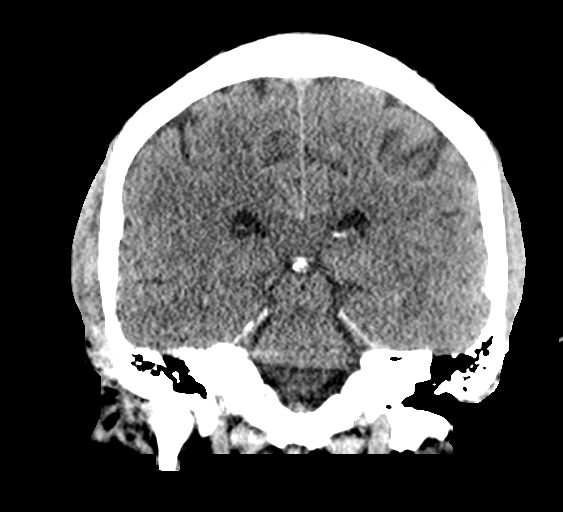
[im 40/72  brain]
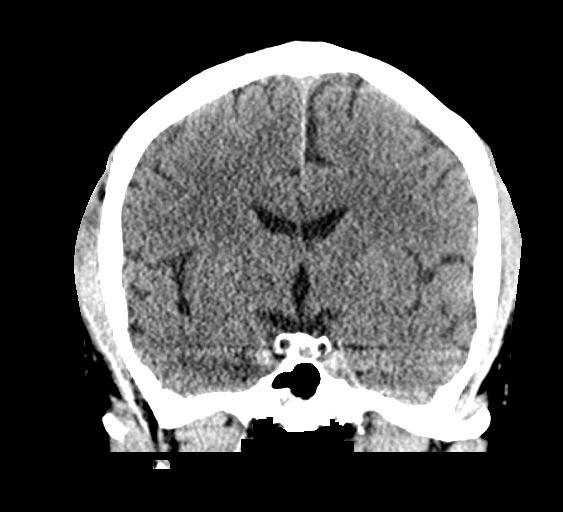

[Series 5: sagittal soft · sagittal · 0.34mm/px · 3 of 59 slices shown]
[im 20/59  brain]
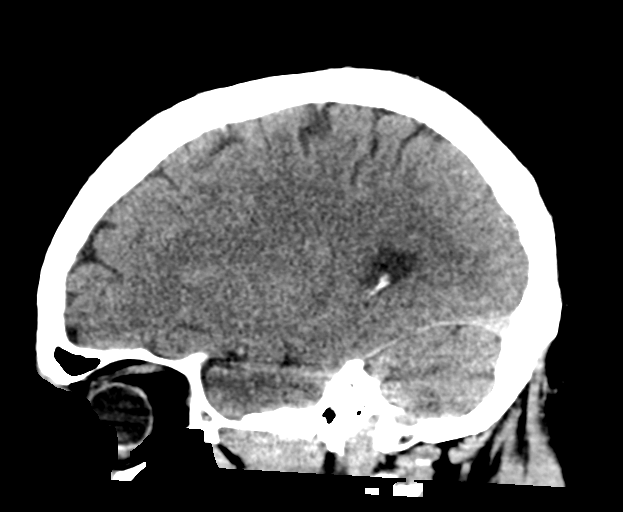
[im 30/59  brain]
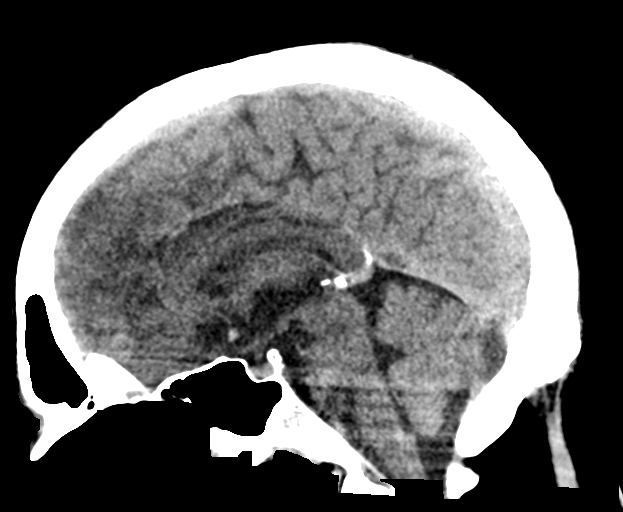
[im 39/59  brain]
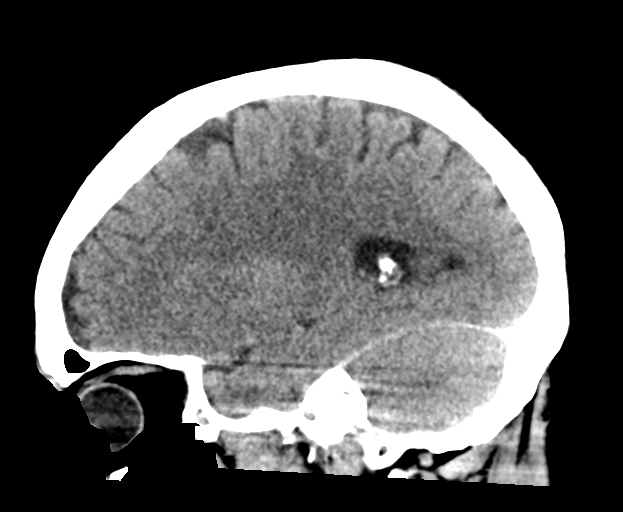

[15 of 47 positions shown; findings below may reference images not displayed]

FINDINGS: CT HEAD FINDINGS

Brain: Ventricles are normal in size and configuration. There is no
mass, hemorrhage, edema or other evidence of acute parenchymal
abnormality. No extra-axial hemorrhage.

Vascular: No hyperdense vessel or unexpected calcification.

Skull: Normal. Negative for fracture or focal lesion.

Sinuses/Orbits: No acute finding.

Other: None.

CT CERVICAL SPINE FINDINGS

Alignment: No evidence of acute vertebral body subluxation.

Skull base and vertebrae: No fracture line or displaced fracture
fragment is seen. Facet joints are normally aligned.

Soft tissues and spinal canal: No prevertebral fluid or swelling. No
visible canal hematoma.

Disc levels: Degenerative spondylosis at the C4-5 through C6-7
levels, at least moderate in degree with associated disc space
narrowings, osseous spurring and posterior disc-osteophytic bulges.
The disc-osteophytic bulges are causing moderate central canal
stenoses at the C4-5 through C6-7 levels. There is additional
degenerative hypertrophy of the facets and uncovertebral joints
causing at least moderate neural foramen stenosis at C4-5 through
C6-7 with possible associated nerve root impingements.

Upper chest: Negative.

Other: None
IMPRESSION: 1. Negative head CT. No intracranial mass, hemorrhage or edema. No
skull fracture.
2. No fracture or acute subluxation within the cervical spine.
3. Degenerative spondylosis of the mid and lower cervical spine, at
least moderate in degree, as detailed above.

## 2024-02-01 ENCOUNTER — Other Ambulatory Visit: Payer: Self-pay | Admitting: Physician Assistant

## 2024-02-01 DIAGNOSIS — R911 Solitary pulmonary nodule: Secondary | ICD-10-CM

## 2024-02-09 ENCOUNTER — Ambulatory Visit: Payer: Non-veteran care

## 2024-02-12 ENCOUNTER — Ambulatory Visit
Admission: EM | Admit: 2024-02-12 | Discharge: 2024-02-12 | Disposition: A | Discharge status: Home / Self Care | Payer: Non-veteran care

## 2024-02-12 DIAGNOSIS — U071 COVID-19: Secondary | ICD-10-CM | POA: Diagnosis not present

## 2024-02-12 MED ORDER — NIRMATRELVIR/RITONAVIR (PAXLOVID)TABLET: 3.0000 | ORAL_TABLET | Freq: Two times a day (BID) | ORAL | 0 refills | Status: AC

## 2024-02-12 NOTE — ED Provider Notes (Signed)
Ryan Gomez    CSN: [id number] Arrival date & time: 02/12/24  1825      History   Chief Complaint Chief Complaint  Patient presents with   Cough   Headache    HPI Ryan Gomez is a 58 y.o. male.  Patient presents with cough and headache today.  No fever, shortness of breath, vomiting, diarrhea.  No OTC medications taken.  He tested positive for COVID at home today.  The history is provided by the patient and medical records.    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 - He actually went to the emergency room about a week after onset of symptoms. - Reportedly had a normal echocardiogram and 48-hour monitor.    Patient Active Problem List   Diagnosis Date Noted   Major depressive disorder, recurrent severe without psychotic features (HCC)    History of stroke 05/27/2016    Past Surgical History:  Procedure Laterality Date   APPP     Bilateral feet      Tendon repair   CHOLECYSTECTOMY     HERNIA REPAIR     rt elbow tendon release         Home Medications    Prior to Admission medications   Medication Sig Start Date End Date Taking? Authorizing Provider  aspirin EC 81 MG tablet Take 81 mg by mouth daily. Swallow whole.   Yes [provider]  ezetimibe (ZETIA) 10 MG tablet Take 0.5 tablets by mouth daily. 07/06/23  Yes [provider]  gabapentin (NEURONTIN) 300 MG capsule Take by mouth. 10/17/23  Yes [provider]  metFORMIN (GLUCOPHAGE) 1000 MG tablet Take by mouth. 09/05/23  Yes [provider]  nirmatrelvir/ritonavir (PAXLOVID) 20 x 150 MG & 10 x 100MG  TABS Take 3 tablets by mouth 2 (two) times daily for 5 days. Take nirmatrelvir (150 mg) two tablets twice daily for 5 days and ritonavir (100 mg) one tablet twice daily for 5 days. 02/12/24  02/17/24 Yes Mickie Bail, NP  buPROPion (WELLBUTRIN) 100 MG tablet Take 300 mg by mouth once.    [provider]  FLUoxetine (PROZAC) 40 MG capsule Take 40 mg by mouth daily.    [provider]  omeprazole (PRILOSEC) 10 MG capsule Take 10 mg by mouth daily.    [provider]    Family History Family History  Problem Relation Age of Onset   Stroke Paternal Grandmother    Diabetes Daughter     Social History Social History   Tobacco Use   Smoking status: Never   Smokeless tobacco: Current  Vaping Use   Vaping status: Never Used  Substance Use Topics   Alcohol use: Not Currently    Alcohol/week: 20.0 standard drinks of alcohol    Types: 20 Cans of beer per week   Drug use: No     Allergies   Patient has no known allergies.   Review of Systems Review of Systems  Constitutional:  Negative for chills and fever.  HENT:  Negative for ear pain and sore throat.   Respiratory:  Positive for cough. Negative for shortness of breath.   Gastrointestinal:  Negative for diarrhea and vomiting.  Neurological:  Positive for headaches.     Physical Exam Triage Vital Signs ED Triage Vitals  Encounter Vitals Group     BP      Systolic BP Percentile      Diastolic BP Percentile      Pulse      Resp      Temp      Temp src      SpO2      Weight      Height      Head Circumference      Peak Flow      Pain Score      Pain Loc      Pain Education      Exclude from Growth Chart    No data found.  Updated Vital Signs BP 128/79   Pulse 82   Temp 99.1 F (37.3 C)   Resp 18   SpO2 97%   Visual Acuity Right Eye Distance:   Left Eye Distance:   Bilateral Distance:    Right Eye Near:   Left Eye Near:    Bilateral Near:     Physical Exam Constitutional:      General: He is not in acute distress. HENT:     Right Ear: Tympanic membrane normal.     Left Ear: Tympanic membrane normal.     Nose: Nose normal.     Mouth/Throat:     Mouth:  Mucous membranes are moist.     Pharynx: Oropharynx is clear.  Cardiovascular:     Rate and Rhythm: Normal rate and regular rhythm.     Heart sounds: Normal heart sounds.  Pulmonary:     Effort: Pulmonary effort is normal. No respiratory distress.     Breath sounds: Normal breath sounds.  Neurological:     Mental Status: He is alert.      UC Treatments / Results  Labs (all labs ordered are listed, but only abnormal results are displayed) Labs Reviewed - No data to display  EKG   Radiology No results found.  Procedures Procedures (including critical care time)  Medications Ordered in UC Medications - No data to display  Initial Impression / Assessment and Plan / UC Course  I have reviewed the triage vital signs and the nursing notes.  Pertinent labs & imaging results that were available during my care of the patient were reviewed by me and considered in my medical decision making (see chart for details).    COVID-19.  Patient symptoms started today.  He tested positive for COVID at home.  He was able to pull up his lab work that was done at the [place] in July 2024; GFR 79 at that time.  He denies history of kidney or liver problems.  Treating with Paxlovid.  Discussed that this medication is emergency authorized for treatment of COVID.  Discussed the side effects of Paxlovid, including dysgeusia, diarrhea, myalgias, hypertension.  Also discussed the possibility of rebound COVID.  Instructed patient to notify his PCP that he is COVID positive and taking Paxlovid.  ED precautions given.  Patient agrees to plan of care.    Final Clinical Impressions(s) / UC Diagnoses   Final diagnoses:  COVID-19     Discharge Instructions      Take the Paxlovid as directed.  Take Tylenol as needed for fever or discomfort.  Rest and keep yourself hydrated.    Go to the emergency department if you have shortness of breath or other concerning symptoms.    Contact your primary care provider  to let them know that you are COVID positive  and taking Paxlovid.         ED Prescriptions     Medication Sig Dispense Auth. Provider   nirmatrelvir/ritonavir (PAXLOVID) 20 x 150 MG & 10 x 100MG  TABS Take 3 tablets by mouth 2 (two) times daily for 5 days. Take nirmatrelvir (150 mg) two tablets twice daily for 5 days and ritonavir (100 mg) one tablet twice daily for 5 days. 30 tablet Mickie Bail, NP      PDMP not reviewed this encounter.   Mickie Bail, NP 02/12/24 2005

## 2024-02-12 NOTE — Discharge Instructions (Addendum)
Take the Paxlovid as directed.  Take Tylenol as needed for fever or discomfort.  Rest and keep yourself hydrated.    Go to the emergency department if you have shortness of breath or other concerning symptoms.    Contact your primary care provider to let them know that you are COVID positive and taking Paxlovid.

## 2024-02-12 NOTE — ED Triage Notes (Signed)
Patient to Urgent Care with complaints of nagging cough/ headaches. Denies any known fevers.  Symptoms started today. Positive home Covid test.   Meds: no otc meds today.

## 2024-03-19 ENCOUNTER — Ambulatory Visit
Admission: RE | Admit: 2024-03-19 | Discharge: 2024-03-19 | Disposition: A | Discharge status: Home / Self Care | Source: Ambulatory Visit | Attending: Physician Assistant | Admitting: Physician Assistant

## 2024-03-19 DIAGNOSIS — R911 Solitary pulmonary nodule: Secondary | ICD-10-CM | POA: Insufficient documentation

## 2024-03-21 ENCOUNTER — Institutional Professional Consult (permissible substitution): Admitting: Neurology
